# Patient Record
Sex: Male | Born: 1960 | Race: White | Hispanic: No | Marital: Married | Smoking: Never smoker
Health system: Southern US, Community
[De-identification: ages and names within clinical notes are randomized; demographics above are authoritative.]

## PROBLEM LIST (undated history)

## (undated) DIAGNOSIS — J45909 Unspecified asthma, uncomplicated: Secondary | ICD-10-CM

---

## 2014-12-19 ENCOUNTER — Encounter (HOSPITAL_COMMUNITY): Payer: Self-pay | Admitting: Emergency Medicine

## 2014-12-19 ENCOUNTER — Emergency Department (HOSPITAL_COMMUNITY)
Admission: EM | Admit: 2014-12-19 | Discharge: 2014-12-20 | Disposition: A | Discharge status: Home / Self Care | Payer: No Typology Code available for payment source | Attending: Emergency Medicine | Admitting: Emergency Medicine

## 2014-12-19 DIAGNOSIS — S0181XA Laceration without foreign body of other part of head, initial encounter: Secondary | ICD-10-CM | POA: Diagnosis not present

## 2014-12-19 DIAGNOSIS — Y998 Other external cause status: Secondary | ICD-10-CM | POA: Diagnosis not present

## 2014-12-19 DIAGNOSIS — S61411A Laceration without foreign body of right hand, initial encounter: Secondary | ICD-10-CM

## 2014-12-19 DIAGNOSIS — T148XXA Other injury of unspecified body region, initial encounter: Secondary | ICD-10-CM

## 2014-12-19 DIAGNOSIS — Y92410 Unspecified street and highway as the place of occurrence of the external cause: Secondary | ICD-10-CM | POA: Insufficient documentation

## 2014-12-19 DIAGNOSIS — Y9389 Activity, other specified: Secondary | ICD-10-CM | POA: Diagnosis not present

## 2014-12-19 DIAGNOSIS — S6991XA Unspecified injury of right wrist, hand and finger(s), initial encounter: Secondary | ICD-10-CM | POA: Diagnosis present
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
## 2014-12-20 ENCOUNTER — Ambulatory Visit (HOSPITAL_COMMUNITY): Payer: Self-pay
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
## 2014-12-20 NOTE — ED Notes (Signed)
Pt refused CT head and cervical spine

## 2015-08-22 ENCOUNTER — Encounter (HOSPITAL_COMMUNITY): Payer: Self-pay | Admitting: Nurse Practitioner

## 2015-08-22 ENCOUNTER — Emergency Department (HOSPITAL_COMMUNITY): Payer: BLUE CROSS/BLUE SHIELD

## 2015-08-22 ENCOUNTER — Inpatient Hospital Stay (HOSPITAL_COMMUNITY)
Admission: EM | Admit: 2015-08-22 | Discharge: 2015-08-24 | DRG: 964 | Disposition: A | Discharge status: Home Health | Payer: BLUE CROSS/BLUE SHIELD | Attending: General Surgery | Admitting: General Surgery

## 2015-08-22 DIAGNOSIS — W11XXXA Fall on and from ladder, initial encounter: Secondary | ICD-10-CM | POA: Diagnosis present

## 2015-08-22 DIAGNOSIS — S32301A Unspecified fracture of right ilium, initial encounter for closed fracture: Secondary | ICD-10-CM

## 2015-08-22 DIAGNOSIS — S27321A Contusion of lung, unilateral, initial encounter: Secondary | ICD-10-CM | POA: Diagnosis present

## 2015-08-22 DIAGNOSIS — S2231XA Fracture of one rib, right side, initial encounter for closed fracture: Secondary | ICD-10-CM

## 2015-08-22 DIAGNOSIS — S2241XA Multiple fractures of ribs, right side, initial encounter for closed fracture: Secondary | ICD-10-CM | POA: Diagnosis present

## 2015-08-22 DIAGNOSIS — J45909 Unspecified asthma, uncomplicated: Secondary | ICD-10-CM | POA: Diagnosis present

## 2015-08-22 DIAGNOSIS — J939 Pneumothorax, unspecified: Secondary | ICD-10-CM

## 2015-08-22 DIAGNOSIS — R52 Pain, unspecified: Secondary | ICD-10-CM

## 2015-08-22 DIAGNOSIS — S2249XA Multiple fractures of ribs, unspecified side, initial encounter for closed fracture: Secondary | ICD-10-CM | POA: Diagnosis present

## 2015-08-22 DIAGNOSIS — S51811A Laceration without foreign body of right forearm, initial encounter: Secondary | ICD-10-CM

## 2015-08-22 DIAGNOSIS — Z23 Encounter for immunization: Secondary | ICD-10-CM | POA: Diagnosis not present

## 2015-08-22 DIAGNOSIS — S270XXA Traumatic pneumothorax, initial encounter: Secondary | ICD-10-CM | POA: Diagnosis present

## 2015-08-22 DIAGNOSIS — W19XXXA Unspecified fall, initial encounter: Secondary | ICD-10-CM

## 2015-08-22 HISTORY — DX: Unspecified asthma, uncomplicated: J45.909

## 2015-08-22 LAB — COMPREHENSIVE METABOLIC PANEL
ALBUMIN: 3.9 g/dL (ref 3.5–5.0)
ALT: 56 U/L (ref 17–63)
AST: 50 U/L — ABNORMAL HIGH (ref 15–41)
Alkaline Phosphatase: 62 U/L (ref 38–126)
Anion gap: 9 (ref 5–15)
BUN: 12 mg/dL (ref 6–20)
CALCIUM: 9.2 mg/dL (ref 8.9–10.3)
CO2: 26 mmol/L (ref 22–32)
Chloride: 105 mmol/L (ref 101–111)
Creatinine, Ser: 0.93 mg/dL (ref 0.61–1.24)
Glucose, Bld: 162 mg/dL — ABNORMAL HIGH (ref 65–99)
POTASSIUM: 4 mmol/L (ref 3.5–5.1)
Sodium: 140 mmol/L (ref 135–145)
TOTAL PROTEIN: 7.1 g/dL (ref 6.5–8.1)
Total Bilirubin: 0.8 mg/dL (ref 0.3–1.2)

## 2015-08-22 LAB — CBC WITH DIFFERENTIAL/PLATELET
BASOS PCT: 0 %
Basophils Absolute: 0 10*3/uL (ref 0.0–0.1)
Eosinophils Absolute: 0 10*3/uL (ref 0.0–0.7)
Eosinophils Relative: 0 %
HCT: 44.4 % (ref 39.0–52.0)
Hemoglobin: 15.3 g/dL (ref 13.0–17.0)
Lymphocytes Relative: 6 %
Lymphs Abs: 0.8 10*3/uL (ref 0.7–4.0)
MCH: 30.1 pg (ref 26.0–34.0)
MCHC: 34.5 g/dL (ref 30.0–36.0)
MCV: 87.4 fL (ref 78.0–100.0)
MONO ABS: 0.9 10*3/uL (ref 0.1–1.0)
MONOS PCT: 6 %
NEUTROS ABS: 13.1 10*3/uL — AB (ref 1.7–7.7)
Neutrophils Relative %: 88 %
Platelets: 265 10*3/uL (ref 150–400)
RBC: 5.08 MIL/uL (ref 4.22–5.81)
RDW: 11.8 % (ref 11.5–15.5)
WBC: 14.9 10*3/uL — ABNORMAL HIGH (ref 4.0–10.5)

## 2015-08-22 LAB — I-STAT CHEM 8, ED
BUN: 15 mg/dL (ref 6–20)
CALCIUM ION: 1.15 mmol/L (ref 1.12–1.23)
Chloride: 102 mmol/L (ref 101–111)
Creatinine, Ser: 0.8 mg/dL (ref 0.61–1.24)
Glucose, Bld: 161 mg/dL — ABNORMAL HIGH (ref 65–99)
HEMATOCRIT: 50 % (ref 39.0–52.0)
HEMOGLOBIN: 17 g/dL (ref 13.0–17.0)
Potassium: 3.8 mmol/L (ref 3.5–5.1)
Sodium: 140 mmol/L (ref 135–145)
TCO2: 23 mmol/L (ref 0–100)

## 2015-08-22 LAB — PROTIME-INR
INR: 1.15 (ref 0.00–1.49)
Prothrombin Time: 14.9 seconds (ref 11.6–15.2)

## 2015-08-22 LAB — SAMPLE TO BLOOD BANK

## 2015-08-22 LAB — CDS SEROLOGY

## 2015-08-22 MED ORDER — ACETAMINOPHEN 325 MG PO TABS: 650.0000 mg | ORAL_TABLET | ORAL | Status: DC | PRN

## 2015-08-22 MED ORDER — LIDOCAINE HCL (PF) 1 % IJ SOLN
10.0000 mL | Freq: Once | INTRAMUSCULAR | Status: AC
  Administered 2015-08-22: 10 mL via INTRADERMAL
  Filled 2015-08-22: qty 10

## 2015-08-22 MED ORDER — MORPHINE SULFATE (PF) 2 MG/ML IV SOLN
2.0000 mg | INTRAVENOUS | Status: DC | PRN
  Administered 2015-08-23: 2 mg via INTRAVENOUS
  Filled 2015-08-22: qty 1

## 2015-08-22 MED ORDER — ONDANSETRON HCL 4 MG PO TABS: 4.0000 mg | ORAL_TABLET | Freq: Four times a day (QID) | ORAL | Status: DC | PRN

## 2015-08-22 MED ORDER — SODIUM CHLORIDE 0.9 % IJ SOLN: 3.0000 mL | INTRAMUSCULAR | Status: DC | PRN

## 2015-08-22 MED ORDER — MORPHINE SULFATE (PF) 4 MG/ML IV SOLN
4.0000 mg | Freq: Once | INTRAVENOUS | Status: AC
  Administered 2015-08-22: 4 mg via INTRAVENOUS
  Filled 2015-08-22: qty 1

## 2015-08-22 MED ORDER — ENOXAPARIN SODIUM 40 MG/0.4ML ~~LOC~~ SOLN
40.0000 mg | Freq: Every day | SUBCUTANEOUS | Status: DC
  Administered 2015-08-23 – 2015-08-24 (×2): 40 mg via SUBCUTANEOUS
  Filled 2015-08-22 (×2): qty 0.4

## 2015-08-22 MED ORDER — ALBUTEROL SULFATE (2.5 MG/3ML) 0.083% IN NEBU: 2.5000 mg | INHALATION_SOLUTION | RESPIRATORY_TRACT | Status: DC | PRN

## 2015-08-22 MED ORDER — SODIUM CHLORIDE 0.9 % IJ SOLN
3.0000 mL | Freq: Two times a day (BID) | INTRAMUSCULAR | Status: DC
  Administered 2015-08-23 – 2015-08-24 (×3): 3 mL via INTRAVENOUS

## 2015-08-22 MED ORDER — MOMETASONE FUROATE 220 MCG/INH IN AEPB: 1.0000 | INHALATION_SPRAY | Freq: Every evening | RESPIRATORY_TRACT | Status: DC | PRN

## 2015-08-22 MED ORDER — DOCUSATE SODIUM 100 MG PO CAPS
100.0000 mg | ORAL_CAPSULE | Freq: Two times a day (BID) | ORAL | Status: DC
  Administered 2015-08-23 – 2015-08-24 (×2): 100 mg via ORAL
  Filled 2015-08-22 (×3): qty 1

## 2015-08-22 MED ORDER — IOHEXOL 300 MG/ML  SOLN
100.0000 mL | Freq: Once | INTRAMUSCULAR | Status: AC | PRN
  Administered 2015-08-22: 100 mL via INTRAVENOUS

## 2015-08-22 MED ORDER — OXYCODONE HCL 5 MG PO TABS
5.0000 mg | ORAL_TABLET | ORAL | Status: DC | PRN
  Administered 2015-08-23 (×2): 10 mg via ORAL
  Administered 2015-08-23: 5 mg via ORAL
  Administered 2015-08-23 – 2015-08-24 (×3): 10 mg via ORAL
  Filled 2015-08-22 (×3): qty 2
  Filled 2015-08-22: qty 1
  Filled 2015-08-22 (×2): qty 2

## 2015-08-22 MED ORDER — SODIUM CHLORIDE 0.9 % IV SOLN: 250.0000 mL | INTRAVENOUS | Status: DC | PRN

## 2015-08-22 MED ORDER — DIPHENHYDRAMINE HCL 25 MG PO CAPS: 25.0000 mg | ORAL_CAPSULE | Freq: Four times a day (QID) | ORAL | Status: DC | PRN

## 2015-08-22 MED ORDER — ONDANSETRON HCL 4 MG/2ML IJ SOLN: 4.0000 mg | Freq: Four times a day (QID) | INTRAMUSCULAR | Status: DC | PRN

## 2015-08-22 MED ORDER — ALUM & MAG HYDROXIDE-SIMETH 200-200-20 MG/5ML PO SUSP: 30.0000 mL | Freq: Four times a day (QID) | ORAL | Status: DC | PRN

## 2015-08-22 NOTE — ED Provider Notes (Signed)
CSN: [id number]     Arrival date & time 08/22/15  1529 History   First MD Initiated Contact with Patient 08/22/15 1637     Chief Complaint  Patient presents with  . Fall     (Consider location/radiation/quality/duration/timing/severity/associated sxs/prior Treatment) Patient is a 55 y.o. male presenting with fall. The history is provided by the patient and medical records.  Fall Associated symptoms include arthralgias and chest pain.    55 year old male with history of asthma, presenting to the ED after a fall. Patient was approximately 6 feet up on a ladder when he slipped and fell, landing on his chest onto brick surface. He denies any head injury or loss of consciousness.  Patient states he has pain in his right chest wall, worse with lying prone or deep breathing. He states he also has pain in his right hip. He states when he fell he "got the wind knocked out of him".  He has been able to ambulate since accident, actually walked into ED independently.  Denies numbness or weakness of his extremities.  Denies headache, neck pain, dizziness, confusion, visual disturbance, tinnitus, changes in speech.  No low back pain or bowel/bladder incontinence. He did sustain a laceration to his right forearm as well as a long linear abrasion.  Reports tetanus is UTD.  No anti-coagulants.  VSS.  Past Medical History  Diagnosis Date  . Asthma    History reviewed. No pertinent past surgical history. History reviewed. No pertinent family history. Social History  Substance Use Topics  . Smoking status: Never Smoker   . Smokeless tobacco: None  . Alcohol Use: No    Review of Systems  Cardiovascular: Positive for chest pain.  Musculoskeletal: Positive for arthralgias.  All other systems reviewed and are negative.     Allergies  Augmentin  Home Medications   Prior to Admission medications   Not on File   BP 119/83 mmHg  Pulse 89  Temp(Src) 98.7 F (37.1 C) (Oral)  Resp 17  SpO2 99%    Physical Exam  Constitutional: He is oriented to person, place, and time. He appears well-developed and well-nourished. No distress.  HENT:  Head: Normocephalic and atraumatic.  Mouth/Throat: Oropharynx is clear and moist.  No visible signs of head trauma  Eyes: Conjunctivae, EOM and lids are normal. Pupils are equal, round, and reactive to light.  Pupils symmetric and reactive bilaterally  Neck: Normal range of motion and full passive range of motion without pain. Neck supple. No spinous process tenderness and no muscular tenderness present. No rigidity.  No TTP or midline deformities; full ROM maintained  Cardiovascular: Normal rate, regular rhythm and normal heart sounds.   Pulmonary/Chest: Effort normal. No respiratory distress. He has decreased breath sounds. He has no wheezes.  No significant tenderness or deformities of chest wall, reports pain with movement and breathing; decreased breath sounds on right when compared with left  Abdominal: Soft. Bowel sounds are normal.  Musculoskeletal: Normal range of motion.  Abrasion noted to right lateral hip; TTP noted along posterior aspect; no acute deformity noted; no leg shortening or malrotation; legs are NVI bilaterally Vertical 8cm abrasion to right forearm as well as 4cm horizontal laceration; no active bleeding currently; forearm is non-tender; no swelling or deformities noted Extremities are NVI x4, compartments soft  Neurological: He is alert and oriented to person, place, and time.  AAOx3, answering questions appropriately; equal strength UE and LE bilaterally; CN grossly intact; moves all extremities appropriately without ataxia; no focal neuro  deficits or facial asymmetry appreciated  Skin: Skin is warm and dry. He is not diaphoretic.  Psychiatric: He has a normal mood and affect.  Nursing note and vitals reviewed.   ED Course  Procedures (including critical care time)  LACERATION REPAIR Performed by: Garlon Hatchet Authorized by: Garlon Hatchet Consent: Verbal consent obtained. Risks and benefits: risks, benefits and alternatives were discussed Consent given by: patient Patient identity confirmed: provided demographic data Prepped and Draped in normal sterile fashion Wound explored  Laceration Location: right forearm  Laceration Length: 4 cm  No Foreign Bodies seen or palpated  Anesthesia: local infiltration  Local anesthetic: lidocaine 1% without epinephrine  Anesthetic total: 5 ml  Irrigation method: syringe Amount of cleaning: standard  Skin closure: 4-0 prolene  Number of sutures: 5  Technique: simple interrupted  Patient tolerance: Patient tolerated the procedure well with no immediate complications.  Labs Review Labs Reviewed  CBC WITH DIFFERENTIAL/PLATELET - Abnormal; Notable for the following:    WBC 14.9 (*)    Neutro Abs 13.1 (*)    All other components within normal limits  COMPREHENSIVE METABOLIC PANEL - Abnormal; Notable for the following:    Glucose, Bld 162 (*)    AST 50 (*)    All other components within normal limits  I-STAT CHEM 8, ED - Abnormal; Notable for the following:    Glucose, Bld 161 (*)    All other components within normal limits  PROTIME-INR  CDS SEROLOGY  SAMPLE TO BLOOD BANK    Imaging Review Dg Chest 2 View  08/22/2015  CLINICAL DATA:  Patient fell off of a ladder onto chest, painful inspiration currently EXAM: CHEST  2 VIEW COMPARISON:  None. FINDINGS: The heart size and vascular pattern are normal. Minimally displaced fractures of the posterior lateral right sixth seventh and eighth ribs. Possible 1 cm right apical pneumothorax. No pleural effusion. Left lung is clear. IMPRESSION: Right-sided rib fractures with probable small right apical pneumothorax. Confirmatory imaging with either expiratory radiograph or CT thorax suggested. Critical Value/emergent results were called by telephone at the time of interpretation on 08/22/2015 at 5:00 pm  to Dr. Gwyneth Sprout , who verbally acknowledged these results. Electronically Signed   By: Esperanza Heir M.D.   On: 08/22/2015 17:00   Ct Chest W Contrast  08/22/2015  CLINICAL DATA:  Trauma, fell 6 feet from ladder landing on right hip and chest. Right hip pain and difficulty breathing. EXAM: CT CHEST, ABDOMEN, AND PELVIS WITH CONTRAST TECHNIQUE: Multidetector CT imaging of the chest, abdomen and pelvis was performed following the standard protocol during bolus administration of intravenous contrast. CONTRAST:  OMNIPAQUE IOHEXOL 300 MG/ML  SOLN COMPARISON:  Chest and pelvic radiographs earlier this day. FINDINGS: CT CHEST Small right-sided pneumothorax with apical, medial, and anterior inferior components. Minimally displaced fractures of right posterior lateral sixth through eighth ribs with adjacent pleural thickening and minimal adjacent pulmonary contusion. Additional ground-glass opacities in the right lower lobe likely additional sites of contusion. Left lung is clear. No acute traumatic aortic injury. Pleural thickening but no frank pleural effusion. No pericardial effusion. No mediastinal or hilar adenopathy. Sternum is intact. No fracture or subluxation of the thoracic spine. Included shoulder girdles are intact. CT ABDOMEN AND PELVIS No acute traumatic injury to the liver, gallbladder, spleen, adrenal glands, pancreas, or kidneys. Simple cyst in the right kidney measures 12 mm. No free air or free fluid. No mesenteric hematoma. No dilated or thickened bowel loops. Abdominal aorta is  normal in caliber with mild atherosclerosis. No retroperitoneal fluid. Bladder is physiologically distended.  No pelvic free fluid. Comminuted mildly displaced fracture of the right iliac wing. No extension to the sacroiliac joints or acetabulum. Sacroiliac joints and pubic symphysis remain congruent. No additional pelvic fracture. Mild degenerative change in the lumbar spine without acute fracture. Subcutaneous  soft tissue edema and hematoma adjacent to the right iliac fracture, no active extravasation. IMPRESSION: 1. Minimally displaced fractures of right posterior lateral sixth through eighth ribs with small right-sided pneumothorax and minimal pulmonary contusion adjacent to the fracture sites and in the right lower lobe. 2. Comminuted mildly displaced fracture involving the right iliac wing with adjacent soft tissue hematoma. 3. No additional acute traumatic injury in the abdomen or pelvis. Intra-abdominal and pelvic structures appear intact. These results were called by telephone at the time of interpretation on 08/22/2015 at 6:10 pm to [facility] , who verbally acknowledged these results. Electronically Signed   By: Rubye OaksMelanie  Ehinger M.D.   On: 08/22/2015 18:11   Ct Abdomen Pelvis W Contrast  08/22/2015  CLINICAL DATA:  Trauma, fell 6 feet from ladder landing on right hip and chest. Right hip pain and difficulty breathing. EXAM: CT CHEST, ABDOMEN, AND PELVIS WITH CONTRAST TECHNIQUE: Multidetector CT imaging of the chest, abdomen and pelvis was performed following the standard protocol during bolus administration of intravenous contrast. CONTRAST:  100mL OMNIPAQUE IOHEXOL 300 MG/ML  SOLN COMPARISON:  Chest and pelvic radiographs earlier this day. FINDINGS: CT CHEST Small right-sided pneumothorax with apical, medial, and anterior inferior components. Minimally displaced fractures of right posterior lateral sixth through eighth ribs with adjacent pleural thickening and minimal adjacent pulmonary contusion. Additional ground-glass opacities in the right lower lobe likely additional sites of contusion. Left lung is clear. No acute traumatic aortic injury. Pleural thickening but no frank pleural effusion. No pericardial effusion. No mediastinal or hilar adenopathy. Sternum is intact. No fracture or subluxation of the thoracic spine. Included shoulder girdles are intact. CT ABDOMEN AND PELVIS No acute traumatic injury  to the liver, gallbladder, spleen, adrenal glands, pancreas, or kidneys. Simple cyst in the right kidney measures 12 mm. No free air or free fluid. No mesenteric hematoma. No dilated or thickened bowel loops. Abdominal aorta is normal in caliber with mild atherosclerosis. No retroperitoneal fluid. Bladder is physiologically distended.  No pelvic free fluid. Comminuted mildly displaced fracture of the right iliac wing. No extension to the sacroiliac joints or acetabulum. Sacroiliac joints and pubic symphysis remain congruent. No additional pelvic fracture. Mild degenerative change in the lumbar spine without acute fracture. Subcutaneous soft tissue edema and hematoma adjacent to the right iliac fracture, no active extravasation. IMPRESSION: 1. Minimally displaced fractures of right posterior lateral sixth through eighth ribs with small right-sided pneumothorax and minimal pulmonary contusion adjacent to the fracture sites and in the right lower lobe. 2. Comminuted mildly displaced fracture involving the right iliac wing with adjacent soft tissue hematoma. 3. No additional acute traumatic injury in the abdomen or pelvis. Intra-abdominal and pelvic structures appear intact. These results were called by telephone at the time of interpretation on 08/22/2015 at 6:10 pm to [facility] , who verbally acknowledged these results. Electronically Signed   By: Rubye OaksMelanie  Ehinger M.D.   On: 08/22/2015 18:11   Dg Hip Unilat With Pelvis 2-3 Views Right  08/22/2015  CLINICAL DATA:  Fall. EXAM: DG HIP (WITH OR WITHOUT PELVIS) 2-3V RIGHT COMPARISON:  None. FINDINGS: There is a comminuted fracture involving the right  iliac wing. The right hip appears located. No dislocations. Mild bilateral hip osteoarthritis. IMPRESSION: 1. Comminuted fracture involves the right iliac wing. Electronically Signed   By: Signa Kell M.D.   On: 08/22/2015 16:57   I have personally reviewed and evaluated these images and lab results as part of my  medical decision-making.   EKG Interpretation None      MDM   Final diagnoses:  Rib fractures, right, closed, initial encounter  Pneumothorax on right  Fracture of iliac crest, right, closed, initial encounter [facility])   55 year old male here after fall from 6 foot ladder. He landed onto anterior chest on brick surface. No head injury or loss of consciousness. He denies any current headache, dizziness, or neck pain. Patient has no CS tenderness on exam. He has full range of motion. He is neurologically intact. He has normal strength and sensation of all 4 extremities. He reports pain on the right side of his chest with breathing and movement, no significant tenderness with palpation.  Also has some abrasions and tenderness of right hip.  His vital signs are stable on room air.  Plain films with displaced rib fractures and likely PTX as well as right pelvic wing fracture.  Given mechanism, will obtain chest and abdominal scans.  No neurologic symptoms currently, no neck pain, and no anti-coagulants so will hold off on CT head/neck at this time.    CT chest with confirmation of displaced right 6th- 8th ribs and small right sided PTX with pulmonary contusion.  Communited and mildly displaced fx of right iliac wing with soft tissue hematoma.  Patient VS remain stable.  He is on 2L supplemental O2 for comfort.  Case discussed with trauma service, Dr. Donell Beers who will evaluate in the ED for admission.  His right forearm laceration was repaired as above, he tolerated procedure well without complications.  His tetanus is UTD.  Case discussed with attending physician, Dr. Anitra Lauth, who evaluated patient and agrees with assessment and plan of care.  Garlon Hatchet, PA-C 08/22/15 1956  Gwyneth Sprout, MD 08/22/15 2325

## 2015-08-22 NOTE — ED Notes (Addendum)
Pt fell about 6 feet off a ladder landing on anterior body onto bricks. He denies head injury, loc. He has laceration to R elbow dressed by ems, abrasion to RFA, R hip pain, and "felt like i got the wind knocked out of me." c/o painful inspiration. He is A&Ox4, mae, breathing easily.

## 2015-08-22 NOTE — H&P (Signed)
History   Jacob Edwards is an 55 y.o. male.   Chief Complaint:  Chief Complaint  Patient presents with  . Fall    Fall This is a new problem. The current episode started today. The problem has been unchanged. Associated symptoms include chest pain. Pertinent negatives include no abdominal pain, anorexia, arthralgias, change in bowel habit, chills, congestion, coughing, diaphoresis, fatigue, fever, headaches, joint swelling, myalgias, nausea, neck pain, numbness, rash, sore throat, swollen glands, urinary symptoms, vertigo, visual change, vomiting or weakness. The symptoms are aggravated by exertion.   Patient was on a ladder today and fell 6 feet.  His chest and side struck the brick.  He did not strike his head or neck.  He had not been drinking or using illicit substances.  He had no LOC.  He was able to ambulate.  He felt like the wind got knocked out of him.    Past Medical History  Diagnosis Date  . Asthma     History reviewed. No pertinent past surgical history.  History reviewed. No pertinent family history. Social History:  reports that he has never smoked. He does not have any smokeless tobacco history on file. He reports that he does not drink alcohol or use illicit drugs.  Allergies   Allergies  Allergen Reactions  . Augmentin [Amoxicillin-Pot Clavulanate] Hives    Home Medications   (Not in a hospital admission)  Trauma Course   Results for orders placed or performed during the hospital encounter of 08/22/15 (from the past 48 hour(s))  CBC with Differential     Status: Abnormal   Collection Time: 08/22/15  5:30 PM  Result Value Ref Range   WBC 14.9 (H) 4.0 - 10.5 K/uL   RBC 5.08 4.22 - 5.81 MIL/uL   Hemoglobin 15.3 13.0 - 17.0 g/dL   HCT 44.4 39.0 - 52.0 %   MCV 87.4 78.0 - 100.0 fL   MCH 30.1 26.0 - 34.0 pg   MCHC 34.5 30.0 - 36.0 g/dL   RDW 11.8 11.5 - 15.5 %   Platelets 265 150 - 400 K/uL   Neutrophils Relative % 88 %   Neutro Abs 13.1 (H) 1.7 - 7.7  K/uL   Lymphocytes Relative 6 %   Lymphs Abs 0.8 0.7 - 4.0 K/uL   Monocytes Relative 6 %   Monocytes Absolute 0.9 0.1 - 1.0 K/uL   Eosinophils Relative 0 %   Eosinophils Absolute 0.0 0.0 - 0.7 K/uL   Basophils Relative 0 %   Basophils Absolute 0.0 0.0 - 0.1 K/uL  Comprehensive metabolic panel     Status: Abnormal   Collection Time: 08/22/15  5:30 PM  Result Value Ref Range   Sodium 140 135 - 145 mmol/L   Potassium 4.0 3.5 - 5.1 mmol/L   Chloride 105 101 - 111 mmol/L   CO2 26 22 - 32 mmol/L   Glucose, Bld 162 (H) 65 - 99 mg/dL   BUN 12 6 - 20 mg/dL   Creatinine, Ser 0.93 0.61 - 1.24 mg/dL   Calcium 9.2 8.9 - 10.3 mg/dL   Total Protein 7.1 6.5 - 8.1 g/dL   Albumin 3.9 3.5 - 5.0 g/dL   AST 50 (H) 15 - 41 U/L   ALT 56 17 - 63 U/L   Alkaline Phosphatase 62 38 - 126 U/L   Total Bilirubin 0.8 0.3 - 1.2 mg/dL   GFR calc non Af Amer >60 >60 mL/min   GFR calc Af Amer >60 >60 mL/min  Comment: (NOTE) The eGFR has been calculated using the CKD EPI equation. This calculation has not been validated in all clinical situations. eGFR's persistently <60 mL/min signify possible Chronic Kidney Disease.    Anion gap 9 5 - 15  Sample to Blood Bank     Status: None   Collection Time: 08/22/15  5:30 PM  Result Value Ref Range   Blood Bank Specimen SAMPLE AVAILABLE FOR TESTING    Sample Expiration 08/23/2015   Protime-INR     Status: None   Collection Time: 08/22/15  5:30 PM  Result Value Ref Range   Prothrombin Time 14.9 11.6 - 15.2 seconds   INR 1.15 0.00 - 1.49  CDS serology     Status: None   Collection Time: 08/22/15  5:30 PM  Result Value Ref Range   CDS serology specimen      SPECIMEN WILL BE HELD FOR 14 DAYS IF TESTING IS REQUIRED  I-stat chem 8, ed     Status: Abnormal   Collection Time: 08/22/15  6:08 PM  Result Value Ref Range   Sodium 140 135 - 145 mmol/L   Potassium 3.8 3.5 - 5.1 mmol/L   Chloride 102 101 - 111 mmol/L   BUN 15 6 - 20 mg/dL   Creatinine, Ser 0.80 0.61 -  1.24 mg/dL   Glucose, Bld 161 (H) 65 - 99 mg/dL   Calcium, Ion 1.15 1.12 - 1.23 mmol/L   TCO2 23 0 - 100 mmol/L   Hemoglobin 17.0 13.0 - 17.0 g/dL   HCT 50.0 39.0 - 52.0 %   Dg Chest 2 View  08/22/2015  CLINICAL DATA:  Patient fell off of a ladder onto chest, painful inspiration currently EXAM: CHEST  2 VIEW COMPARISON:  None. FINDINGS: The heart size and vascular pattern are normal. Minimally displaced fractures of the posterior lateral right sixth seventh and eighth ribs. Possible 1 cm right apical pneumothorax. No pleural effusion. Left lung is clear. IMPRESSION: Right-sided rib fractures with probable small right apical pneumothorax. Confirmatory imaging with either expiratory radiograph or CT thorax suggested. Critical Value/emergent results were called by telephone at the time of interpretation on 08/22/2015 at 5:00 pm to Dr. Blanchie Dessert , who verbally acknowledged these results. Electronically Signed   By: Skipper Cliche M.D.   On: 08/22/2015 17:00   Ct Chest W Contrast  08/22/2015  CLINICAL DATA:  Trauma, fell 6 feet from ladder landing on right hip and chest. Right hip pain and difficulty breathing. EXAM: CT CHEST, ABDOMEN, AND PELVIS WITH CONTRAST TECHNIQUE: Multidetector CT imaging of the chest, abdomen and pelvis was performed following the standard protocol during bolus administration of intravenous contrast. CONTRAST:  166m OMNIPAQUE IOHEXOL 300 MG/ML  SOLN COMPARISON:  Chest and pelvic radiographs earlier this day. FINDINGS: CT CHEST Small right-sided pneumothorax with apical, medial, and anterior inferior components. Minimally displaced fractures of right posterior lateral sixth through eighth ribs with adjacent pleural thickening and minimal adjacent pulmonary contusion. Additional ground-glass opacities in the right lower lobe likely additional sites of contusion. Left lung is clear. No acute traumatic aortic injury. Pleural thickening but no frank pleural effusion. No pericardial  effusion. No mediastinal or hilar adenopathy. Sternum is intact. No fracture or subluxation of the thoracic spine. Included shoulder girdles are intact. CT ABDOMEN AND PELVIS No acute traumatic injury to the liver, gallbladder, spleen, adrenal glands, pancreas, or kidneys. Simple cyst in the right kidney measures 12 mm. No free air or free fluid. No mesenteric hematoma. No dilated or  thickened bowel loops. Abdominal aorta is normal in caliber with mild atherosclerosis. No retroperitoneal fluid. Bladder is physiologically distended.  No pelvic free fluid. Comminuted mildly displaced fracture of the right iliac wing. No extension to the sacroiliac joints or acetabulum. Sacroiliac joints and pubic symphysis remain congruent. No additional pelvic fracture. Mild degenerative change in the lumbar spine without acute fracture. Subcutaneous soft tissue edema and hematoma adjacent to the right iliac fracture, no active extravasation. IMPRESSION: 1. Minimally displaced fractures of right posterior lateral sixth through eighth ribs with small right-sided pneumothorax and minimal pulmonary contusion adjacent to the fracture sites and in the right lower lobe. 2. Comminuted mildly displaced fracture involving the right iliac wing with adjacent soft tissue hematoma. 3. No additional acute traumatic injury in the abdomen or pelvis. Intra-abdominal and pelvic structures appear intact. These results were called by telephone at the time of interpretation on 08/22/2015 at 6:10 pm to [place] , who verbally acknowledged these results. Electronically Signed   By: Jeb Levering M.D.   On: 08/22/2015 18:11   Ct Abdomen Pelvis W Contrast  08/22/2015  CLINICAL DATA:  Trauma, fell 6 feet from ladder landing on right hip and chest. Right hip pain and difficulty breathing. EXAM: CT CHEST, ABDOMEN, AND PELVIS WITH CONTRAST TECHNIQUE: Multidetector CT imaging of the chest, abdomen and pelvis was performed following the standard protocol  during bolus administration of intravenous contrast. CONTRAST:  119m OMNIPAQUE IOHEXOL 300 MG/ML  SOLN COMPARISON:  Chest and pelvic radiographs earlier this day. FINDINGS: CT CHEST Small right-sided pneumothorax with apical, medial, and anterior inferior components. Minimally displaced fractures of right posterior lateral sixth through eighth ribs with adjacent pleural thickening and minimal adjacent pulmonary contusion. Additional ground-glass opacities in the right lower lobe likely additional sites of contusion. Left lung is clear. No acute traumatic aortic injury. Pleural thickening but no frank pleural effusion. No pericardial effusion. No mediastinal or hilar adenopathy. Sternum is intact. No fracture or subluxation of the thoracic spine. Included shoulder girdles are intact. CT ABDOMEN AND PELVIS No acute traumatic injury to the liver, gallbladder, spleen, adrenal glands, pancreas, or kidneys. Simple cyst in the right kidney measures 12 mm. No free air or free fluid. No mesenteric hematoma. No dilated or thickened bowel loops. Abdominal aorta is normal in caliber with mild atherosclerosis. No retroperitoneal fluid. Bladder is physiologically distended.  No pelvic free fluid. Comminuted mildly displaced fracture of the right iliac wing. No extension to the sacroiliac joints or acetabulum. Sacroiliac joints and pubic symphysis remain congruent. No additional pelvic fracture. Mild degenerative change in the lumbar spine without acute fracture. Subcutaneous soft tissue edema and hematoma adjacent to the right iliac fracture, no active extravasation. IMPRESSION: 1. Minimally displaced fractures of right posterior lateral sixth through eighth ribs with small right-sided pneumothorax and minimal pulmonary contusion adjacent to the fracture sites and in the right lower lobe. 2. Comminuted mildly displaced fracture involving the right iliac wing with adjacent soft tissue hematoma. 3. No additional acute traumatic  injury in the abdomen or pelvis. Intra-abdominal and pelvic structures appear intact. These results were called by telephone at the time of interpretation on 08/22/2015 at 6:10 pm to [place], who verbally acknowledged these results. Electronically Signed   By: MJeb LeveringM.D.   On: 08/22/2015 18:11   Dg Hip Unilat With Pelvis 2-3 Views Right  08/22/2015  CLINICAL DATA:  Fall. EXAM: DG HIP (WITH OR WITHOUT PELVIS) 2-3V RIGHT COMPARISON:  None. FINDINGS: There is  a comminuted fracture involving the right iliac wing. The right hip appears located. No dislocations. Mild bilateral hip osteoarthritis. IMPRESSION: 1. Comminuted fracture involves the right iliac wing. Electronically Signed   By: Kerby Moors M.D.   On: 08/22/2015 16:57    Review of Systems  Constitutional: Negative for fever, chills, diaphoresis and fatigue.  HENT: Negative for congestion and sore throat.   Eyes: Negative.   Respiratory: Positive for shortness of breath (mild). Negative for cough.   Cardiovascular: Positive for chest pain.  Gastrointestinal: Negative for nausea, vomiting, abdominal pain, anorexia and change in bowel habit.  Musculoskeletal: Positive for joint pain (right hip/back). Negative for myalgias, joint swelling, arthralgias and neck pain.  Skin: Negative for rash.  Neurological: Negative.  Negative for vertigo, weakness, numbness and headaches.  Endo/Heme/Allergies: Negative.   Psychiatric/Behavioral: Negative.     Blood pressure 118/72, pulse 102, temperature 98.7 F (37.1 C), temperature source Oral, resp. rate 23, SpO2 100 %. Physical Exam  Constitutional: He is oriented to person, place, and time. He appears well-developed and well-nourished. He appears distressed (mild).  HENT:  Head: Normocephalic and atraumatic.  Right Ear: External ear normal.  Left Ear: External ear normal.  Mouth/Throat: Oropharynx is clear and moist.  Eyes: Conjunctivae are normal. Pupils are equal, round, and  reactive to light. No scleral icterus.  Neck: Normal range of motion. Neck supple. No tracheal deviation present. No thyromegaly present.  Cardiovascular: Normal rate, regular rhythm, normal heart sounds and intact distal pulses.   Respiratory: Effort normal and breath sounds normal. No respiratory distress. He has no wheezes. He has no rales. He exhibits no tenderness.  GI: Soft. He exhibits no distension. There is no tenderness. There is no rebound.  Musculoskeletal: Normal range of motion. He exhibits tenderness. He exhibits no edema.  Lymphadenopathy:    He has no cervical adenopathy.  Neurological: He is alert and oriented to person, place, and time. Coordination normal.  Skin: Skin is warm and dry. No rash noted. He is not diaphoretic. No erythema. No pallor.  Psychiatric: He has a normal mood and affect. His behavior is normal. Judgment and thought content normal.     Assessment/Plan Right pneumothorax Right rib fractures 6-8 Right pulmonary contusion Right forearm laceration Right iliac wing fracture.   Admit for pain control Pulmonary toilet Continuous pulse ox monitoring.   ED to repair forearm laceration Repeat CXR in AM Ortho consult   Job Holtsclaw 08/22/2015, 7:12 PM   Procedures

## 2015-08-23 ENCOUNTER — Inpatient Hospital Stay (HOSPITAL_COMMUNITY): Payer: BLUE CROSS/BLUE SHIELD

## 2015-08-23 LAB — MRSA PCR SCREENING: MRSA BY PCR: NEGATIVE

## 2015-08-23 MED ORDER — ENSURE ENLIVE PO LIQD
237.0000 mL | Freq: Three times a day (TID) | ORAL | Status: DC
  Administered 2015-08-23 – 2015-08-24 (×4): 237 mL via ORAL

## 2015-08-23 MED ORDER — PNEUMOCOCCAL VAC POLYVALENT 25 MCG/0.5ML IJ INJ
0.5000 mL | INJECTION | INTRAMUSCULAR | Status: AC
  Administered 2015-08-24: 0.5 mL via INTRAMUSCULAR
  Filled 2015-08-23: qty 0.5

## 2015-08-23 MED ORDER — POLYETHYLENE GLYCOL 3350 17 G PO PACK
17.0000 g | PACK | Freq: Every day | ORAL | Status: DC
  Administered 2015-08-24: 17 g via ORAL
  Filled 2015-08-23: qty 1

## 2015-08-23 MED ORDER — METHOCARBAMOL 500 MG PO TABS
1000.0000 mg | ORAL_TABLET | Freq: Three times a day (TID) | ORAL | Status: DC | PRN
  Administered 2015-08-23 – 2015-08-24 (×3): 1000 mg via ORAL
  Filled 2015-08-23 (×3): qty 2

## 2015-08-23 NOTE — Progress Notes (Deleted)
Report given 5N.  .BP 130/79 mmHg  Pulse 92  Temp(Src) 98.1 F (36.7 C) (Oral)  Resp 26  Ht 5\' 4"  (1.626 m)  Wt 49.9 kg (110 lb 0.2 oz)  BMI 18.87 kg/m2  SpO2 100%

## 2015-08-23 NOTE — Progress Notes (Signed)
[facility]  Progress Note   LOS: 1 day   Subjective: Pt doing okay, pain fairly well controlled.  No N/V, tolerating diet, but a bit anorexic.  Worried about having a BM.  Pain in right hip and chest.  No SOB, no headache.  No pain in extremities, neck, or back.  Objective: Vital signs in last 24 hours: Temp:  [98.1 F (36.7 C)-99 F (37.2 C)] 98.2 F (36.8 C) (01/09 0827) Pulse Rate:  [67-102] 69 (01/09 0600) Resp:  [17-23] 19 (01/09 0600) BP: (112-126)/(71-84) 113/73 mmHg (01/09 0600) SpO2:  [97 %-100 %] 99 % (01/09 0600) Weight:  [49.9 kg (110 lb 0.2 oz)] 49.9 kg (110 lb 0.2 oz) (01/08 2330)    Lab Results:  CBC  Recent Labs  08/22/15 1730 08/22/15 1808  WBC 14.9*  --   HGB 15.3 17.0  HCT 44.4 50.0  PLT 265  --    BMET  Recent Labs  08/22/15 1730 08/22/15 1808  NA 140 140  K 4.0 3.8  CL 105 102  CO2 26  --   GLUCOSE 162* 161*  BUN 12 15  CREATININE 0.93 0.80  CALCIUM 9.2  --     Imaging: Dg Chest 2 View  08/23/2015  CLINICAL DATA:  Bilateral rib pain.  Fall. EXAM: CHEST  2 VIEW COMPARISON:  CT 08/22/2015. FINDINGS: Mediastinum hilar structures stable. Heart size stable. Small right apical pneumothorax again noted. Right rib fractures best demonstrated by CT. IMPRESSION: Small right apical pneumothorax again noted. No interim change. Right rib fractures best demonstrated by CT. Electronically Signed   By: Maisie Fushomas  Register   On: 08/23/2015 08:11   Dg Chest 2 View  08/22/2015  CLINICAL DATA:  Patient fell off of a ladder onto chest, painful inspiration currently EXAM: CHEST  2 VIEW COMPARISON:  None. FINDINGS: The heart size and vascular pattern are normal. Minimally displaced fractures of the posterior lateral right sixth seventh and eighth ribs. Possible 1 cm right apical pneumothorax. No pleural effusion. Left lung is clear. IMPRESSION: Right-sided rib fractures with probable small right apical pneumothorax. Confirmatory imaging  with either expiratory radiograph or CT thorax suggested. Critical Value/emergent results were called by telephone at the time of interpretation on 08/22/2015 at 5:00 pm to Dr. Gwyneth SproutWHITNEY PLUNKETT , who verbally acknowledged these results. Electronically Signed   By: Esperanza Heiraymond  Rubner M.D.   On: 08/22/2015 17:00   Ct Chest W Contrast  08/22/2015  CLINICAL DATA:  Trauma, fell 6 feet from ladder landing on right hip and chest. Right hip pain and difficulty breathing. EXAM: CT CHEST, ABDOMEN, AND PELVIS WITH CONTRAST TECHNIQUE: Multidetector CT imaging of the chest, abdomen and pelvis was performed following the standard protocol during bolus administration of intravenous contrast. CONTRAST:  100mL OMNIPAQUE IOHEXOL 300 MG/ML  SOLN COMPARISON:  Chest and pelvic radiographs earlier this day. FINDINGS: CT CHEST Small right-sided pneumothorax with apical, medial, and anterior inferior components. Minimally displaced fractures of right posterior lateral sixth through eighth ribs with adjacent pleural thickening and minimal adjacent pulmonary contusion. Additional ground-glass opacities in the right lower lobe likely additional sites of contusion. Left lung is clear. No acute traumatic aortic injury. Pleural thickening but no frank pleural effusion. No pericardial effusion. No mediastinal or hilar adenopathy. Sternum is intact. No fracture or subluxation of the thoracic spine. Included shoulder girdles are intact. CT ABDOMEN AND PELVIS No acute traumatic injury to the liver, gallbladder, spleen, adrenal glands, pancreas, or kidneys. Simple cyst in the  right kidney measures 12 mm. No free air or free fluid. No mesenteric hematoma. No dilated or thickened bowel loops. Abdominal aorta is normal in caliber with mild atherosclerosis. No retroperitoneal fluid. Bladder is physiologically distended.  No pelvic free fluid. Comminuted mildly displaced fracture of the right iliac wing. No extension to the sacroiliac joints or acetabulum.  Sacroiliac joints and pubic symphysis remain congruent. No additional pelvic fracture. Mild degenerative change in the lumbar spine without acute fracture. Subcutaneous soft tissue edema and hematoma adjacent to the right iliac fracture, no active extravasation. IMPRESSION: 1. Minimally displaced fractures of right posterior lateral sixth through eighth ribs with small right-sided pneumothorax and minimal pulmonary contusion adjacent to the fracture sites and in the right lower lobe. 2. Comminuted mildly displaced fracture involving the right iliac wing with adjacent soft tissue hematoma. 3. No additional acute traumatic injury in the abdomen or pelvis. Intra-abdominal and pelvic structures appear intact. These results were called by telephone at the time of interpretation on 08/22/2015 at 6:10 pm to PA [facility] , who verbally acknowledged these results. Electronically Signed   By: Rubye Oaks M.D.   On: 08/22/2015 18:11   Ct Abdomen Pelvis W Contrast  08/22/2015  CLINICAL DATA:  Trauma, fell 6 feet from ladder landing on right hip and chest. Right hip pain and difficulty breathing. EXAM: CT CHEST, ABDOMEN, AND PELVIS WITH CONTRAST TECHNIQUE: Multidetector CT imaging of the chest, abdomen and pelvis was performed following the standard protocol during bolus administration of intravenous contrast. CONTRAST:  OMNIPAQUE IOHEXOL 300 MG/ML  SOLN COMPARISON:  Chest and pelvic radiographs earlier this day. FINDINGS: CT CHEST Small right-sided pneumothorax with apical, medial, and anterior inferior components. Minimally displaced fractures of right posterior lateral sixth through eighth ribs with adjacent pleural thickening and minimal adjacent pulmonary contusion. Additional ground-glass opacities in the right lower lobe likely additional sites of contusion. Left lung is clear. No acute traumatic aortic injury. Pleural thickening but no frank pleural effusion. No pericardial effusion. No mediastinal or hilar  adenopathy. Sternum is intact. No fracture or subluxation of the thoracic spine. Included shoulder girdles are intact. CT ABDOMEN AND PELVIS No acute traumatic injury to the liver, gallbladder, spleen, adrenal glands, pancreas, or kidneys. Simple cyst in the right kidney measures 12 mm. No free air or free fluid. No mesenteric hematoma. No dilated or thickened bowel loops. Abdominal aorta is normal in caliber with mild atherosclerosis. No retroperitoneal fluid. Bladder is physiologically distended.  No pelvic free fluid. Comminuted mildly displaced fracture of the right iliac wing. No extension to the sacroiliac joints or acetabulum. Sacroiliac joints and pubic symphysis remain congruent. No additional pelvic fracture. Mild degenerative change in the lumbar spine without acute fracture. Subcutaneous soft tissue edema and hematoma adjacent to the right iliac fracture, no active extravasation. IMPRESSION: 1. Minimally displaced fractures of right posterior lateral sixth through eighth ribs with small right-sided pneumothorax and minimal pulmonary contusion adjacent to the fracture sites and in the right lower lobe. 2. Comminuted mildly displaced fracture involving the right iliac wing with adjacent soft tissue hematoma. 3. No additional acute traumatic injury in the abdomen or pelvis. Intra-abdominal and pelvic structures appear intact. These results were called by telephone at the time of interpretation on 08/22/2015 at 6:10 pm to PA [facility] , who verbally acknowledged these results. Electronically Signed   By: Rubye Oaks M.D.   On: 08/22/2015 18:11   Dg Hip Unilat With Pelvis 2-3 Views Right  08/22/2015  CLINICAL DATA:  Fall. EXAM: DG HIP (WITH OR WITHOUT PELVIS) 2-3V RIGHT COMPARISON:  None. FINDINGS: There is a comminuted fracture involving the right iliac wing. The right hip appears located. No dislocations. Mild bilateral hip osteoarthritis. IMPRESSION: 1. Comminuted fracture involves the right iliac  wing. Electronically Signed   By: Signa Kell M.D.   On: 08/22/2015 16:57     PE: General: pleasant, WD/WN white male who is laying in bed in NAD HEENT: head is normocephalic, atraumatic.  Sclera are noninjected.  PERRL.  Ears and nose without any masses or lesions.  Mouth is pink and moist Heart: regular, rate, and rhythm.  Normal s1,s2. No obvious murmurs, gallops, or rubs noted.  Palpable radial and pedal pulses bilaterally Lungs: pain in right/left chest.  CTAB, no wheezes, rhonchi, or rales noted.  Respiratory effort nonlabored, IS to 2200. Abd: soft, NT/ND, +BS, no masses, hernias, or organomegaly MS: dry dressing on right forearm.  all 4 extremities are symmetrical with no cyanosis, clubbing, or edema.  Gross CSM intact to all 4 extremities.  Pain in right hip. Psych: A&Ox3 with an appropriate affect.   Assessment/Plan: Fall off ladder Right pneumothorax - f/u CXR stable, Pulm toilet, hopefully won't require CT, continuous pulse ox Right rib fractures 6-8 - IS up to 2200 Right pulmonary contusion  Right forearm laceration - ED repaired, local care Right iliac wing fracture - per Dr. Eulah Pont, WBAT right leg, no surgery planned VTE - SCD's, Lovenox  FEN - reg diet, add ensure, labs in am, add robaxin Dispo -- Therapy, pain control, rehab vs home, transfer to floor   Jorje Guild, [place] Pager: [phone] General Trauma [place] Pager: [phone]   08/23/2015

## 2015-08-23 NOTE — Clinical Social Work Note (Signed)
Clinical Social Work Assessment  Patient Details  Name: Jacob Edwards MRN: [id number] Date of Birth: 09/12/1960  Date of referral:  08/23/15               Reason for consult:  Trauma                Permission sought to share information with:  Family Supports Permission granted to share information::  Yes, Verbal Permission Granted  Name::     Hannah Raya  Relationship::  Spouse  Contact Information:  [phone]  Housing/Transportation Living arrangements for the past 2 months:  Single Family Home Source of Information:  Patient, Spouse Patient Interpreter Needed:  None Criminal Activity/Legal Involvement Pertinent to Current Situation/Hospitalization:  No - Comment as needed Significant Relationships:  Spouse, Dependent Children Lives with:  Minor Children, Spouse Do you feel safe going back to the place where you live?  Yes Need for family participation in patient care:  Yes (Comment)  Care giving concerns:  Patient spouse states that patient has several relatives to provide support and his employer plans to allow him to work from home.  Patient spouse did inquire about home health support and equipment - CSW explained CM role and follow up once evaluated by therapies.   Social Worker assessment / plan:  Clinical Social Worker met with patient and spouse at bedside to offer support and discuss patient needs at discharge.  Patient states that he was up on a ladder attempting to clean out the gutter when the ladder slipped and he fell.  Patient and wife both state that patient will be able to remain all on one level once discharged home.  Patient with good family support and hopeful that he will work well with therapies and return home.  Patient spouse prepared to take time off from work and additional family members have agreed to provide support.  Patient agreeable with plans to return home at discharge.  Currently there are no concerns regarding substance use.  SBIRT completed.  No  resources needed at this time.  CSW signing off.  Please reconsult if further social work needs arise.  Employment status:  Full-Time Insurance information:  Managed Care PT Recommendations:  Not assessed at this time Information / Referral to community resources:  SBIRT  Patient/Family's Response to care:  Patient and patient wife agreeable with return home at discharge and understanding for the potential need for additional rehab.  Patient and family verbalize understanding of CSW role and appreciation for support.  Patient/Family's Understanding of and Emotional Response to Diagnosis, Current Treatment, and Prognosis:  Patient and patient family realistic regarding the potential for patient needs at discharge.  Patient coping well with his injuries and family appropriately planning for patient needs at home.  Emotional Assessment Appearance:  Appears stated age Attitude/Demeanor/Rapport:   (Cooperative and Appropriate) Affect (typically observed):  Appropriate, Calm, Flat, Pleasant Orientation:  Oriented to Self, Oriented to Place, Oriented to  Time, Oriented to Situation Alcohol / Substance use:  Never Used Psych involvement (Current and /or in the community):  No (Comment)  Discharge Needs  Concerns to be addressed:  Care Coordination Readmission within the last 30 days:  No Current discharge risk:  None Barriers to Discharge:  Continued Medical Work up   Jesse Scinto, LCSW [phone]  

## 2015-08-23 NOTE — Progress Notes (Signed)
Occupational Therapy Evaluation Patient Details Name: Jacob AhmadiCarl Kitko MRN: [id number] DOB: 05/27/1961 Today's Date: 08/23/2015    History of Present Illness s/p fall off ladder with resulting R pneumothorax, R rib fxs 6-8; R pulmonary contusion; R forearm laceration - repaired in ED; R iliac wing fx (WBAT)   Clinical Impression   PTA, pt independent with ADL and mobility. Pt presents with functional decline with ADL and mobility due to deficits below. Pt able to take a few steps to the recliner with RW. O2 Sats 98-100 RA. Limited primarily by pain. Pt has w/c and RW at home and will most likely be able to D/C home with initial 24/7 S. Will follow acutely to maximize independence with ADL and facilitate safe D/C home. Clarified WBS as RLE WBAT over phone with MD. Nsg notified.    Follow Up Recommendations  Home health OT;Supervision/Assistance - 24 hour (initially)    Equipment Recommendations  3 in 1 bedside comode (if pt does not have one)    Recommendations for Other Services       Precautions / Restrictions Precautions Precautions: Fall Restrictions Weight Bearing Restrictions: Yes RLE Weight Bearing: Weight bearing as tolerated      Mobility Bed Mobility Overal bed mobility: Needs Assistance Bed Mobility: Supine to Sit (from elevated position)     Supine to sit: Min assist        Transfers Overall transfer level: Needs assistance Equipment used: Rolling walker (2 wheeled) Transfers: Sit to/from UGI CorporationStand;Stand Pivot Transfers Sit to Stand: Min assist Stand pivot transfers: Min guard            Balance Overall balance assessment: Needs assistance   Sitting balance-Leahy Scale: Good       Standing balance-Leahy Scale: Fair                              ADL Overall ADL's : Needs assistance/impaired     Grooming: Set up   Upper Body Bathing: Set up;Sitting   Lower Body Bathing: Moderate assistance;Sit to/from stand   Upper Body Dressing :  Minimal assistance   Lower Body Dressing: Moderate assistance;Sit to/from stand   Toilet Transfer: Minimal assistance   Toileting- ArchitectClothing Manipulation and Hygiene: Min guard       Functional mobility during ADLs: Minimal assistance;Rolling walker;Cueing for sequencing General ADL Comments: limited by pain     Vision     Perception     Praxis      Pertinent Vitals/Pain Pain Assessment: 0-10 Pain Score: 7  Pain Location: R hip with movement Pain Descriptors / Indicators: Aching;Shooting Pain Intervention(s): Limited activity within patient's tolerance;Monitored during session;Repositioned;Patient requesting pain meds-RN notified     Hand Dominance Right   Extremity/Trunk Assessment Upper Extremity Assessment Upper Extremity Assessment: Overall WFL for tasks assessed;RUE deficits/detail RUE Deficits / Details: limited ROM shoulder due to pain from rib fractures   Lower Extremity Assessment Lower Extremity Assessment: Defer to PT evaluation   Cervical / Trunk Assessment Cervical / Trunk Assessment: Normal   Communication Communication Communication: No difficulties   Cognition Arousal/Alertness: Awake/alert Behavior During Therapy: WFL for tasks assessed/performed Overall Cognitive Status: Within Functional Limits for tasks assessed                     General Comments       Exercises Exercises: Other exercises Other Exercises Other Exercises: encouraged by foot pumps Other Exercises: encouraged incentive spirometer   Shoulder Instructions  Home Living Family/patient expects to be discharged to:: Private residence Living Arrangements: Spouse/significant other;Other relatives Available Help at Discharge: Family;Available 24 hours/day Type of Home: House Home Access: Stairs to enter [employer] of Steps: 3 Entrance Stairs-Rails: None Home Layout: One level     Bathroom Shower/Tub: Tub/shower unit Shower/tub characteristics:  Door Firefighter: Standard Bathroom Accessibility: Yes How Accessible: Accessible via walker Home Equipment: Walker - 2 wheels;Walker - 4 wheels;Shower seat;Wheelchair - manual          Prior Functioning/Environment Level of Independence: Independent             OT Diagnosis: Generalized weakness;Acute pain   OT Problem List: Decreased strength;Decreased range of motion;Decreased activity tolerance;Impaired balance (sitting and/or standing);Decreased safety awareness;Decreased knowledge of use of DME or AE;Cardiopulmonary status limiting activity;Pain   OT Treatment/Interventions: Self-care/ADL training;DME and/or AE instruction;Therapeutic activities;Patient/family education    OT Goals(Current goals can be found in the care plan section) Acute Rehab OT Goals Patient Stated Goal: to go home OT Goal Formulation: With patient Time For Goal Achievement: 09/06/15 Potential to Achieve Goals: Good ADL Goals Pt Will Perform Lower Body Bathing: with adaptive equipment;sit to/from stand;with supervision;with set-up Pt Will Perform Lower Body Dressing: with set-up;with supervision;with adaptive equipment;sit to/from stand Pt Will Transfer to Toilet: with supervision;with set-up;ambulating;bedside commode Pt Will Perform Toileting - Clothing Manipulation and hygiene: with modified independence;sit to/from stand  OT Frequency: Min 2X/week   Barriers to D/C:            Co-evaluation              End of Session Equipment Utilized During Treatment: Gait belt;Rolling walker Nurse Communication: Mobility status;Weight bearing status;Precautions  Activity Tolerance: Patient tolerated treatment well Patient left: in chair;with call bell/phone within reach   Time: 1605-1630 OT Time Calculation (min): 25 min Charges:  OT General Charges $OT Visit: 1 Procedure OT Evaluation $OT Eval Moderate Complexity: 1 Procedure OT Treatments $Self Care/Home Management : 8-22  mins G-Codes:    Aurielle Slingerland,HILLARY 2015/09/17, 4:44 PM   [facility], OTR/L  [phone] 2015/09/17

## 2015-08-23 NOTE — Progress Notes (Signed)
Pt transferred to 5N, room 26.   Marland Kitchen.BP 130/79 mmHg  Pulse 92  Temp(Src) 98.1 F (36.7 C) (Oral)  Resp 26  Ht 5\' 4"  (1.626 m)  Wt 49.9 kg (110 lb 0.2 oz)  BMI 18.87 kg/m2  SpO2 100%

## 2015-08-23 NOTE — Consult Note (Signed)
ORTHOPAEDIC CONSULTATION  REQUESTING PHYSICIAN: Trauma Md, MD  Chief Complaint: R iliac crest fracture  HPI: Jacob Edwards is a 55 y.o. male who fell from a ladder approximately 109f in height yesterday, landing on a brick surface.  He denies hitting his head or LOC.  He complain of R sided chest wall pain, especially with deep inspiration.  He also has pain in the R hip with ambulation.  He was able to mobilize after the fall, and presented to the ED for further evaluation.  In the ED, he was found to have multiple fractures of the R ribs as well as a R iliac crest fracture.   Past Medical History  Diagnosis Date  . Asthma    History reviewed. No pertinent past surgical history. Social History   Social History  . Marital Status: Married    Spouse Name: N/A  . Number of Children: N/A  . Years of Education: N/A   Social History Main Topics  . Smoking status: Never Smoker   . Smokeless tobacco: None  . Alcohol Use: No  . Drug Use: No  . Sexual Activity: Not Asked   Other Topics Concern  . None   Social History Narrative   History reviewed. No pertinent family history. Allergies  Allergen Reactions  . Augmentin [Amoxicillin-Pot Clavulanate] Hives   Prior to Admission medications   Medication Sig Start Date End Date Taking? Authorizing Provider  acetaminophen (TYLENOL) 500 MG tablet Take 500 mg by mouth daily as needed for headache.   Yes Historical Provider, MD  albuterol (PROAIR HFA) 108 (90 Base) MCG/ACT inhaler Inhale 1 puff into the lungs every 4 (four) hours as needed for wheezing or shortness of breath.   Yes Historical Provider, MD  cholecalciferol (VITAMIN D) 1000 units tablet Take 1,000 Units by mouth 2 (two) times daily.   Yes Historical Provider, MD  FLUOCINONIDE EX Apply 1 application topically 2 (two) times daily as needed (rash).   Yes Historical Provider, MD  mometasone (ASMANEX 30 METERED DOSES) 220 MCG/INH inhaler Inhale 1 puff into the lungs at bedtime  as needed (shortness of breath/ wheezing).   Yes Historical Provider, MD  Omega-3 Fatty Acids (FISH OIL PO) Take 1 capsule by mouth daily.   Yes Historical Provider, MD  POTASSIUM PO Take 1 tablet by mouth 2 (two) times daily as needed (muscle cramps).   Yes Historical Provider, MD   Dg Chest 2 View  08/23/2015  CLINICAL DATA:  Bilateral rib pain.  Fall. EXAM: CHEST  2 VIEW COMPARISON:  CT 08/22/2015. FINDINGS: Mediastinum hilar structures stable. Heart size stable. Small right apical pneumothorax again noted. Right rib fractures best demonstrated by CT. IMPRESSION: Small right apical pneumothorax again noted. No interim change. Right rib fractures best demonstrated by CT. Electronically Signed   By: TMarcello Moores Register   On: 08/23/2015 08:11   Dg Chest 2 View  08/22/2015  CLINICAL DATA:  Patient fell off of a ladder onto chest, painful inspiration currently EXAM: CHEST  2 VIEW COMPARISON:  None. FINDINGS: The heart size and vascular pattern are normal. Minimally displaced fractures of the posterior lateral right sixth seventh and eighth ribs. Possible 1 cm right apical pneumothorax. No pleural effusion. Left lung is clear. IMPRESSION: Right-sided rib fractures with probable small right apical pneumothorax. Confirmatory imaging with either expiratory radiograph or CT thorax suggested. Critical Value/emergent results were called by telephone at the time of interpretation on 08/22/2015 at 5:00 pm to Dr. WBlanchie Dessert, who  verbally acknowledged these results. Electronically Signed   By: Skipper Cliche M.D.   On: 08/22/2015 17:00   Ct Chest W Contrast  08/22/2015  CLINICAL DATA:  Trauma, fell 6 feet from ladder landing on right hip and chest. Right hip pain and difficulty breathing. EXAM: CT CHEST, ABDOMEN, AND PELVIS WITH CONTRAST TECHNIQUE: Multidetector CT imaging of the chest, abdomen and pelvis was performed following the standard protocol during bolus administration of intravenous contrast. CONTRAST:   172m OMNIPAQUE IOHEXOL 300 MG/ML  SOLN COMPARISON:  Chest and pelvic radiographs earlier this day. FINDINGS: CT CHEST Small right-sided pneumothorax with apical, medial, and anterior inferior components. Minimally displaced fractures of right posterior lateral sixth through eighth ribs with adjacent pleural thickening and minimal adjacent pulmonary contusion. Additional ground-glass opacities in the right lower lobe likely additional sites of contusion. Left lung is clear. No acute traumatic aortic injury. Pleural thickening but no frank pleural effusion. No pericardial effusion. No mediastinal or hilar adenopathy. Sternum is intact. No fracture or subluxation of the thoracic spine. Included shoulder girdles are intact. CT ABDOMEN AND PELVIS No acute traumatic injury to the liver, gallbladder, spleen, adrenal glands, pancreas, or kidneys. Simple cyst in the right kidney measures 12 mm. No free air or free fluid. No mesenteric hematoma. No dilated or thickened bowel loops. Abdominal aorta is normal in caliber with mild atherosclerosis. No retroperitoneal fluid. Bladder is physiologically distended.  No pelvic free fluid. Comminuted mildly displaced fracture of the right iliac wing. No extension to the sacroiliac joints or acetabulum. Sacroiliac joints and pubic symphysis remain congruent. No additional pelvic fracture. Mild degenerative change in the lumbar spine without acute fracture. Subcutaneous soft tissue edema and hematoma adjacent to the right iliac fracture, no active extravasation. IMPRESSION: 1. Minimally displaced fractures of right posterior lateral sixth through eighth ribs with small right-sided pneumothorax and minimal pulmonary contusion adjacent to the fracture sites and in the right lower lobe. 2. Comminuted mildly displaced fracture involving the right iliac wing with adjacent soft tissue hematoma. 3. No additional acute traumatic injury in the abdomen or pelvis. Intra-abdominal and pelvic  structures appear intact. These results were called by telephone at the time of interpretation on 08/22/2015 at 6:10 pm to P[place]oud, who verbally acknowledged these results. Electronically Signed   By: MJeb LeveringM.D.   On: 08/22/2015 18:11   Ct Abdomen Pelvis W Contrast  08/22/2015  CLINICAL DATA:  Trauma, fell 6 feet from ladder landing on right hip and chest. Right hip pain and difficulty breathing. EXAM: CT CHEST, ABDOMEN, AND PELVIS WITH CONTRAST TECHNIQUE: Multidetector CT imaging of the chest, abdomen and pelvis was performed following the standard protocol during bolus administration of intravenous contrast. CONTRAST:  1091mOMNIPAQUE IOHEXOL 300 MG/ML  SOLN COMPARISON:  Chest and pelvic radiographs earlier this day. FINDINGS: CT CHEST Small right-sided pneumothorax with apical, medial, and anterior inferior components. Minimally displaced fractures of right posterior lateral sixth through eighth ribs with adjacent pleural thickening and minimal adjacent pulmonary contusion. Additional ground-glass opacities in the right lower lobe likely additional sites of contusion. Left lung is clear. No acute traumatic aortic injury. Pleural thickening but no frank pleural effusion. No pericardial effusion. No mediastinal or hilar adenopathy. Sternum is intact. No fracture or subluxation of the thoracic spine. Included shoulder girdles are intact. CT ABDOMEN AND PELVIS No acute traumatic injury to the liver, gallbladder, spleen, adrenal glands, pancreas, or kidneys. Simple cyst in the right kidney measures 12 mm. No free air or  free fluid. No mesenteric hematoma. No dilated or thickened bowel loops. Abdominal aorta is normal in caliber with mild atherosclerosis. No retroperitoneal fluid. Bladder is physiologically distended.  No pelvic free fluid. Comminuted mildly displaced fracture of the right iliac wing. No extension to the sacroiliac joints or acetabulum. Sacroiliac joints and pubic symphysis remain  congruent. No additional pelvic fracture. Mild degenerative change in the lumbar spine without acute fracture. Subcutaneous soft tissue edema and hematoma adjacent to the right iliac fracture, no active extravasation. IMPRESSION: 1. Minimally displaced fractures of right posterior lateral sixth through eighth ribs with small right-sided pneumothorax and minimal pulmonary contusion adjacent to the fracture sites and in the right lower lobe. 2. Comminuted mildly displaced fracture involving the right iliac wing with adjacent soft tissue hematoma. 3. No additional acute traumatic injury in the abdomen or pelvis. Intra-abdominal and pelvic structures appear intact. These results were called by telephone at the time of interpretation on 08/22/2015 at 6:10 pm to [place] , who verbally acknowledged these results. Electronically Signed   By: Jeb Levering M.D.   On: 08/22/2015 18:11   Dg Hip Unilat With Pelvis 2-3 Views Right  08/22/2015  CLINICAL DATA:  Fall. EXAM: DG HIP (WITH OR WITHOUT PELVIS) 2-3V RIGHT COMPARISON:  None. FINDINGS: There is a comminuted fracture involving the right iliac wing. The right hip appears located. No dislocations. Mild bilateral hip osteoarthritis. IMPRESSION: 1. Comminuted fracture involves the right iliac wing. Electronically Signed   By: Kerby Moors M.D.   On: 08/22/2015 16:57    Positive ROS: All other systems have been reviewed and were otherwise negative with the exception of those mentioned in the HPI and as above.  Labs cbc  Recent Labs  08/22/15 1730 08/22/15 1808  WBC 14.9*  --   HGB 15.3 17.0  HCT 44.4 50.0  PLT 265  --     Labs inflam No results for input(s): CRP in the last 72 hours.  Invalid input(s): ESR  Labs coag  Recent Labs  08/22/15 1730  INR 1.15     Recent Labs  08/22/15 1730 08/22/15 1808  NA 140 140  K 4.0 3.8  CL 105 102  CO2 26  --   GLUCOSE 162* 161*  BUN 12 15  CREATININE 0.93 0.80  CALCIUM 9.2  --      Physical Exam: Filed Vitals:   08/23/15 0420 08/23/15 0600  BP: 117/71 113/73  Pulse: 71 69  Temp:    Resp: 17 19   General: Alert, no acute distress Cardiovascular: No pedal edema Respiratory: Pain with deep inspiration, no cyanosis GI: No organomegaly, abdomen is soft and non-tender Skin: laceration to the R forearm that was closed in the ED and bandaged Neurologic: Sensation intact distally Psychiatric: Patient is competent for consent with normal mood and affect Lymphatic: No axillary or cervical lymphadenopathy  MUSCULOSKELETAL:  R hip has no swelling or erythema. Tender to palpation of the R hip.  Mild discomfort with motion of the hip.  Sensation intact with 2+ distal pulses.  Other extremities are atraumatic with painless ROM and NVI.  Assessment: R iliac crest fracture  Plan: Stable iliac crest fracture noted on imaging.  No indication for surgical intervention at this time.  Will allow to be weight bearing as tolerated in the R leg. Will have PT evaluate to see how the patient mobilizes.    Gae Dry, PA-C Cell [phone]   08/23/2015 8:19 AM

## 2015-08-24 DIAGNOSIS — S27321A Contusion of lung, unilateral, initial encounter: Secondary | ICD-10-CM | POA: Diagnosis present

## 2015-08-24 DIAGNOSIS — S32301A Unspecified fracture of right ilium, initial encounter for closed fracture: Secondary | ICD-10-CM

## 2015-08-24 DIAGNOSIS — S51811A Laceration without foreign body of right forearm, initial encounter: Secondary | ICD-10-CM

## 2015-08-24 DIAGNOSIS — S2249XA Multiple fractures of ribs, unspecified side, initial encounter for closed fracture: Secondary | ICD-10-CM | POA: Diagnosis present

## 2015-08-24 LAB — BASIC METABOLIC PANEL
Anion gap: 5 (ref 5–15)
BUN: 12 mg/dL (ref 6–20)
CALCIUM: 8.9 mg/dL (ref 8.9–10.3)
CHLORIDE: 102 mmol/L (ref 101–111)
CO2: 31 mmol/L (ref 22–32)
CREATININE: 0.91 mg/dL (ref 0.61–1.24)
GFR calc Af Amer: >60 mL/min (ref >60)
GFR calc non Af Amer: >60 mL/min (ref >60)
Glucose, Bld: 104 mg/dL — ABNORMAL HIGH (ref 65–99)
Potassium: 4.7 mmol/L (ref 3.5–5.1)
SODIUM: 138 mmol/L (ref 135–145)

## 2015-08-24 LAB — CBC
HCT: 39.4 % (ref 39.0–52.0)
HEMOGLOBIN: 13.1 g/dL (ref 13.0–17.0)
MCH: 30.3 pg (ref 26.0–34.0)
MCHC: 33.2 g/dL (ref 30.0–36.0)
MCV: 91 fL (ref 78.0–100.0)
Platelets: 239 10*3/uL (ref 150–400)
RBC: 4.33 MIL/uL (ref 4.22–5.81)
RDW: 11.9 % (ref 11.5–15.5)
WBC: 9 10*3/uL (ref 4.0–10.5)

## 2015-08-24 MED ORDER — POLYETHYLENE GLYCOL 3350 17 G PO PACK: 17.0000 g | PACK | Freq: Every day | ORAL | Status: AC | PRN

## 2015-08-24 MED ORDER — METHOCARBAMOL 500 MG PO TABS: 1000.0000 mg | ORAL_TABLET | Freq: Three times a day (TID) | ORAL | Status: AC | PRN

## 2015-08-24 MED ORDER — ACETAMINOPHEN 500 MG PO TABS: 500.0000 mg | ORAL_TABLET | Freq: Four times a day (QID) | ORAL | Status: AC | PRN

## 2015-08-24 MED ORDER — ENSURE ENLIVE PO LIQD: 237.0000 mL | Freq: Three times a day (TID) | ORAL | Status: AC

## 2015-08-24 MED ORDER — DOCUSATE SODIUM 100 MG PO CAPS: 100.0000 mg | ORAL_CAPSULE | Freq: Two times a day (BID) | ORAL | Status: AC

## 2015-08-24 MED ORDER — OXYCODONE HCL 5 MG PO TABS
5.0000 mg | ORAL_TABLET | ORAL | Status: AC | PRN
Start: 2015-08-24 — End: ?

## 2015-08-24 MED ORDER — IBUPROFEN 600 MG PO TABS: 600.0000 mg | ORAL_TABLET | Freq: Three times a day (TID) | ORAL | Status: AC | PRN

## 2015-08-24 NOTE — Evaluation (Signed)
Physical Therapy Evaluation Patient Details Name: Jacob AhmadiCarl Deblanc MRN: [id number] DOB: 12/22/1960 Today's Date: 08/24/2015   History of Present Illness  s/p fall off ladder with resulting R pneumothorax, R rib fxs 6-8; R pulmonary contusion; R forearm laceration - repaired in ED; R iliac wing fx (WBAT). PMHx: Asthma  Clinical Impression  Pt very pleasant and eager to be able to walk and move. Mr.Mariotti in chair on arrival, performed bed mobility, gait, stairs and HEp with questions answered. Pt benefits from education and cues throughout for upright posture, breathing technique and safety with gait. Pt motivated and limited by pain. Pt with decreased strength RLE, activity tolerance and gait who will benefit from acute therapy to maximize mobility, gait and function to decrease burden of care.     Follow Up Recommendations No PT follow up    Equipment Recommendations  Rolling walker with 5" wheels    Recommendations for Other Services       Precautions / Restrictions Precautions Precautions: Fall Restrictions Weight Bearing Restrictions: Yes RLE Weight Bearing: Weight bearing as tolerated      Mobility  Bed Mobility Overal bed mobility: Needs Assistance Bed Mobility: Sidelying to Sit;Sit to Sidelying   Sidelying to sit: Min assist     Sit to sidelying: Min assist General bed mobility comments: cues for sequence with min assist to complete transition sit<>side  Transfers Overall transfer level: Needs assistance Equipment used: Rolling walker (2 wheeled) Transfers: Sit to/from Stand Sit to Stand: Min assist Stand pivot transfers: Supervision       General transfer comment: cues for hand placement and sequence  Ambulation/Gait Ambulation/Gait assistance: Supervision Ambulation Distance (Feet): 300 Feet Assistive device: Rolling walker (2 wheeled) Gait Pattern/deviations: Step-through pattern;Decreased stride length   Gait velocity interpretation: Below normal speed for  age/gender General Gait Details: cues for posture, position in RW, safety and gait sequence  Stairs Stairs: Yes Stairs assistance: Min assist Stair Management: Backwards;With walker;Step to pattern Number of Stairs: 2 General stair comments: cues for sequence with min assist to complete and handout provided  Wheelchair Mobility    Modified Rankin (Stroke Patients Only)       Balance Overall balance assessment: Needs assistance Sitting-balance support: No upper extremity supported;Feet supported Sitting balance-Leahy Scale: Fair Sitting balance - Comments: limited by pain   Standing balance support: Bilateral upper extremity supported;During functional activity Standing balance-Leahy Scale: Fair                               Pertinent Vitals/Pain Pain Assessment: Faces Pain Score: 6  Faces Pain Scale: Hurts whole lot Pain Location: right hip and ribs Pain Descriptors / Indicators: Aching;Throbbing Pain Intervention(s): Limited activity within patient's tolerance;Monitored during session;Premedicated before session;Repositioned;Ice applied    Home Living Family/patient expects to be discharged to:: Private residence Living Arrangements: Spouse/significant other;Other relatives Available Help at Discharge: Family;Available 24 hours/day Type of Home: House Home Access: Stairs to enter Entrance Stairs-Rails: None Entrance Stairs-Number of Steps: 3 Home Layout: One level Home Equipment: Walker - standard;Walker - 4 wheels;Shower seat;Wheelchair - manual Additional Comments: equipment belonged to mother, pt states family has a BSC he can borrow as well    Prior Function Level of Independence: Independent               Hand Dominance        Extremity/Trunk Assessment   Upper Extremity Assessment: Defer to OT evaluation  Lower Extremity Assessment: RLE deficits/detail RLE Deficits / Details: decreased strength and ROM secondary to  pain    Cervical / Trunk Assessment: Other exceptions  Communication   Communication: No difficulties  Cognition Arousal/Alertness: Awake/alert Behavior During Therapy: WFL for tasks assessed/performed Overall Cognitive Status: Within Functional Limits for tasks assessed                      General Comments      Exercises General Exercises - Lower Extremity Long Arc Quad: AROM;Seated;Right;10 reps Hip Flexion/Marching: AROM;Seated;Right;10 reps      Assessment/Plan    PT Assessment Patient needs continued PT services  PT Diagnosis Difficulty walking;Acute pain   PT Problem List Decreased strength;Decreased range of motion;Decreased activity tolerance;Pain;Decreased knowledge of use of DME  PT Treatment Interventions Gait training;DME instruction;Functional mobility training;Therapeutic activities;Therapeutic exercise;Patient/family education   PT Goals (Current goals can be found in the Care Plan section) Acute Rehab PT Goals Patient Stated Goal: to be able to return to work PT Goal Formulation: With patient Time For Goal Achievement: 08/31/15 Potential to Achieve Goals: Good    Frequency Min 5X/week   Barriers to discharge        Co-evaluation               End of Session   Activity Tolerance: Patient tolerated treatment well Patient left: in chair;with call bell/phone within reach Nurse Communication: Mobility status         Time: 1610-9604 PT Time Calculation (min) (ACUTE ONLY): 24 min   Charges:   PT Evaluation $PT Eval Moderate Complexity: 1 Procedure PT Treatments $Gait Training: 8-22 mins   PT G CodesDelorse Lek 08/24/2015, 11:06 AM Delaney Meigs, PT [phone]

## 2015-08-24 NOTE — Progress Notes (Signed)
Occupational Therapy Treatment Patient Details Name: Jacob AhmadiCarl Edwards MRN: [id number] DOB: 09/30/1960 Today's Date: 08/24/2015    History of present illness s/p fall off ladder with resulting R pneumothorax, R rib fxs 6-8; R pulmonary contusion; R forearm laceration - repaired in ED; R iliac wing fx (WBAT)   OT comments  Patient is progressing nicely towards OT goals, continue plan of care for now. Pt limited by pain, but eager and motivated to regain his independence and decrease pain. Recommended patient not attempt [facility]ower [facility]. Pt required encouragement for breathing and correct posturing during mobility. Pt tends to tense up and hunch over RW due to pain.    Follow Up Recommendations  Home health OT;Supervision/Assistance - 24 hour (initially)    Equipment Recommendations  3 in 1 bedside comode;Tub/shower bench;Other (comment) (AE - reacher, sock aid, LH sponge, LH shoe horn)    Recommendations for Other Services  None at this time   Precautions / Restrictions Precautions Precautions: Fall Restrictions Weight Bearing Restrictions: Yes RLE Weight Bearing: Weight bearing as tolerated    Mobility Bed Mobility General bed mobility comments: Pt received seated in recliner upon OT entering/exiting room   Transfers Overall transfer level: Needs assistance Equipment used: Rolling walker (2 wheeled) Transfers: Sit to/from Stand Sit to Stand: Min assist         General transfer comment: Cueing for hand placement, sequencing, RLE management, and safety    Balance Overall balance assessment: Needs assistance Sitting-balance support: No upper extremity supported;Feet supported Sitting balance-Leahy Scale: Fair Sitting balance - Comments: limited by pain   Standing balance support: Bilateral upper extremity supported;During functional activity Standing balance-Leahy Scale: Fair   ADL Overall ADL's : Needs assistance/impaired Eating/Feeding: Set  up;Sitting   Grooming: Set up;Sitting   Upper Body Bathing: Set up;Sitting   Lower Body Bathing: Minimal assistance;Sit to/from stand;With adaptive equipment   Upper Body Dressing : Minimal assistance;Sitting Upper Body Dressing Details (indicate cue type and reason): due to pain Lower Body Dressing: Minimal assistance;Sit to/from stand;Cueing for safety;With adaptive equipment   Toilet Transfer: Minimal assistance;BSC;RW;Ambulation Toilet Transfer Details (indicate cue type and reason): BSC over toilet seat   Toileting - Clothing Manipulation Details (indicate cue type and reason): did not occur   Tub/Shower Transfer Details (indicate cue type and reason): did not occur, did discuss tub/shower technique using tub transfer bench vs 3-n-1. Encouraged pt to wait for HHOT to go over best and safest option for getting in/out of tub. Discussed that pt will have to remove door to use DME in tub/shower combo. Functional mobility during ADLs: Minimal assistance;Rolling walker;Cueing for sequencing;Cueing for safety General ADL Comments: Pt continues to be limited by pain, but is very motivated to regain his independence. Went over use of reacher, sock aid, LH sponge, and LH shoe horn for LB ADLs; handout administerred. Pt ambualted to/from BR for toilet transfer using BSC.      Cognition   Behavior During Therapy: WFL for tasks assessed/performed Overall Cognitive Status: Within Functional Limits for tasks assessed                 Pertinent Vitals/ Pain       Pain Assessment: Faces Faces Pain Scale: Hurts whole lot Pain Location: right hip and ribs Pain Descriptors / Indicators: Aching;Shooting;Grimacing;Discomfort;Guarding Pain Intervention(s): Limited activity within patient's tolerance;Monitored during session;Repositioned   Frequency Min 2X/week     Progress Toward Goals  OT Goals(current goals can now befound in the care plan  section)  Progress towards OT goals: Progressing  toward goals  Acute Rehab OT Goals Patient Stated Goal: to go home, get stronger, less pain  Plan Discharge plan remains appropriate;Equipment recommendations need to be updated    End of Session Equipment Utilized During Treatment: Gait belt;Rolling walker   Activity Tolerance Patient tolerated treatment well   Patient Left in chair;with call bell/phone within reach;with nursing/sitter in room  Nurse Communication Other (comment) (encouragement for patient to ambulate to/from BR using RW with staff assist)     Time: 1610-9604 OT Time Calculation (min): 23 min  Charges: OT General Charges $OT Visit: 1 Procedure OT Treatments $Self Care/Home Management : 23-37 mins  Edwin Cap , MS, OTR/L, CLT Pager: [phone]  08/24/2015, 10:31 AM

## 2015-08-24 NOTE — Care Management Note (Signed)
Case Management Note  Patient Details  Name: Jacob Edwards MRN: [id number] Date of Birth: 08/06/1961  Subjective/Objective:  Pt admitted on 08/22/15 s/p fall from ladder with PTX, rib fractures, and fx iliac wing.  PTA, pt independent, lives with family.                    Action/Plan: Pt for dc home today; needs HH follow up and DME.  [facility]  Start of care 24-48h post dc date.  RW and 3 in 1 BSC to be delivered to room prior to dc.  Pt prefers to wait on tub bench; thinks he can borrow one from family member.  Best contact # for pt is [phone].    Expected Discharge Date:   08/24/2015               Expected Discharge Plan:  Home w Home Health Services  In-House Referral:     Discharge planning Services  CM Consult  Post Acute Care Choice:  Home Health Choice offered to:  Patient  DME Arranged:  3-N-1, Walker rolling DME Agency:  [facility]  [facility]  [facility]  [facility]  Status of Service:  Completed, signed off  Medicare Important Message Given:    Date Medicare IM Given:    Medicare IM give by:    Date Additional Medicare IM Given:    Additional Medicare Important Message give by:     If discussed at Long Length of Stay Meetings, dates discussed:    Additional Comments:  Quintella BatonJulie W. Quintus Premo, RN, BSN  Trauma/Neuro ICU Case Manager [phone]

## 2015-08-24 NOTE — Discharge Summary (Signed)
[facility] Trauma Service Discharge Summary   Patient ID: Jacob Edwards MRN: [id number] DOB/AGE: 1961/04/01 55 y.o.  Admit date: 08/22/2015 Discharge date: 08/24/2015  Discharge Diagnoses Patient Active Problem List   Diagnosis Date Noted  . Fracture of multiple ribs 08/24/2015  . Fracture of right iliac wing (HCC) 08/24/2015  . Right pulmonary contusion 08/24/2015  . Laceration of right forearm 08/24/2015  . Traumatic pneumothorax 08/22/2015    Consultants Dr. Eulah Pont (Orthopedics)  Procedures None  Hospital Course:  55 y/o male with history of asthma, presenting to [facility] after a fall. The patient works for [employer].  Patient was approximately 6 feet up on a ladder when he slipped and fell, landing on his chest onto brick surface. He denies any head injury or loss of consciousness. Admits to right chest wall pain and right hip. He states when he fell he "got the wind knocked out of him". He has been able to ambulate since accident, actually walked into ED independently. Denies numbness or weakness of his extremities. Denies headache, neck pain, dizziness, confusion, visual disturbance, tinnitus, changes in speech. No low back pain or bowel/bladder incontinence. He did sustain a laceration to his right forearm as well as a long linear abrasion. The laceration was repaired in the ED.  Reports tetanus is UTD. No anti-coagulants. VSS.   Workup showed small right pneumothorax which did not require chest tube placement.  He also had right rib fractures 6-8, right pulmonary contusion, right forearm laceration, and right iliac wing fracture.  Patient was admitted and was transferred to the SDU for monitoring.  Hgb dropped mildly but then stabilized.  He was transferred to the floor on 08/23/15.  Repeat CXR shows no worsening in the pneumothorax.  Diet was advanced as tolerated.  He was doing 2200 on the IS.  On HD #3, the patient was voiding well, tolerating diet,  ambulating well, pain well controlled, vital signs stable, and felt stable for discharge home.  Patient will follow up in our office in 2 weeks and knows to call with questions or concerns.  He will follow up with Dr. Eulah Pont in 2-4 weeks regarding his right iliac wing fracture.  He was reminded about using caution with NSAIDS and bleeding.  He is recommended to use a bowel regimen to prevent constipation.      Medication List    TAKE these medications        acetaminophen 500 MG tablet  Commonly known as:  TYLENOL  Take 1-1.5 tablets (500-750 mg total) by mouth every 6 (six) hours as needed for headache.     ASMANEX 30 METERED DOSES 220 MCG/INH inhaler  Generic drug:  mometasone  Inhale 1 puff into the lungs at bedtime as needed (shortness of breath/ wheezing).     cholecalciferol 1000 units tablet  Commonly known as:  VITAMIN D  Take 1,000 Units by mouth 2 (two) times daily.     docusate sodium 100 MG capsule  Commonly known as:  COLACE  Take 1 capsule (100 mg total) by mouth 2 (two) times daily.     feeding supplement (ENSURE ENLIVE) Liqd  Take 237 mLs by mouth 3 (three) times daily with meals.     FISH OIL PO  Take 1 capsule by mouth daily.     FLUOCINONIDE EX  Apply 1 application topically 2 (two) times daily as needed (rash).     ibuprofen 600 MG tablet  Commonly known as:  ADVIL,MOTRIN  Take 1  tablet (600 mg total) by mouth every 8 (eight) hours as needed for mild pain or moderate pain. Watch for signs of bleeding, take with a meal.     methocarbamol 500 MG tablet  Commonly known as:  ROBAXIN  Take 2 tablets (1,000 mg total) by mouth every 8 (eight) hours as needed for muscle spasms.     oxyCODONE 5 MG immediate release tablet  Commonly known as:  Oxy IR/ROXICODONE  Take 1-2 tablets (5-10 mg total) by mouth every 4 (four) hours as needed for moderate pain or severe pain.     polyethylene glycol packet  Commonly known as:  MIRALAX / GLYCOLAX  Take 17 g by mouth  daily as needed. Use daily until bowel movements are more regular.  Goal is one bowel movement per day.     POTASSIUM PO  Take 1 tablet by mouth 2 (two) times daily as needed (muscle cramps).     PROAIR HFA 108 (90 Base) MCG/ACT inhaler  Generic drug:  albuterol  Inhale 1 puff into the lungs every 4 (four) hours as needed for wheezing or shortness of breath.         Follow-up Information    Follow up with [facility]. Go on 09/01/2015.   Why:  For post-hospital follow up and suture removal.  Your appointment is on 08/31/14 at 2:30pm, please arrive 30 min early to check in and fill out paperwork.     Contact information:   [address] [place] [id number] [phone]      Schedule an appointment as soon as possible for a visit with MURPHY, Jewel BaizeIMOTHY D, MD.   Specialty:  Orthopedic Surgery   Why:  For post-hospital follow up regarding your right pelvic wing fracture within 2-4 weeks.   Contact information:   [address] [place] [phone]       Signed: Nonie HoyerMegan N. Valor [place], [facility]  Trauma Service [phone]  08/24/2015, 12:31 PM

## 2015-08-24 NOTE — Discharge Instructions (Addendum)
Watch for constipation and make sure to take a stool softener.  You may need to add Miralax as needed to help you have more comfortable bowel movements.  Watch for increased shortness of breath or difficulty breathing and call us immediately or go to the ER if this occurs.    Watch for bleeding with ibuprofen/motrin.  Always take with a meal to prevent stomach upset.  Pneumothorax A pneumothorax, commonly called a collapsed lung, is a condition in which air leaks from a lung and builds up in the space between the lung and the chest wall (pleural space). The air in a pneumothorax is trapped outside the lung and takes up space, preventing the lung from fully expanding. This is a condition that usually occurs suddenly. The buildup of air may be small or large. A small pneumothorax may go away on its own. When a pneumothorax is larger, it will often require medical treatment and hospitalization.  CAUSES  A pneumothorax can sometimes happen quickly with no apparent cause. People with underlying lung problems, particularly COPD or emphysema, are at higher risk of pneumothorax. However, pneumothorax can happen quickly even in people with no prior known lung problems. Trauma, surgery, medical procedures, or injury to the chest wall can also cause a pneumothorax. SIGNS AND SYMPTOMS  Sometimes a pneumothorax will have no symptoms. When symptoms are present, they can include:  Chest pain.  Shortness of breath.  Increased rate of breathing.  Bluish color to your lips or skin (cyanosis). DIAGNOSIS  Pneumothorax is usually diagnosed by a chest X-ray or chest CT scan. Your health care provider will also take a medical history and perform a physical exam to determine why you may have a pneumothorax. TREATMENT  A small pneumothorax may go away on its own without treatment. Extra oxygen can sometimes help a small pneumothorax go away more quickly. For a larger pneumothorax or a pneumothorax that is causing  symptoms, a procedure is usually needed to drain the air.In some cases, the health care provider may drain the air using a needle. In other cases, a chest tube may be inserted into the pleural space. A chest tube is a small tube placed between the ribs and into the pleural space. This removes the extra air and allows the lung to expand back to its normal size. A large pneumothorax will usually require a hospital stay. If there is ongoing air leakage into the pleural space, then the chest tube may need to remain in place for several days until the air leak has healed. In some cases, surgery may be needed.  HOME CARE INSTRUCTIONS   Only take over-the-counter or prescription medicines as directed by your health care provider.  If a cough or pain makes it difficult for you to sleep at night, try sleeping in a semi-upright position in a recliner or by using 2 or 3 pillows.  Rest and limit activity as directed by your health care provider.  If you had a chest tube and it was removed, ask your health care provider when it is okay to remove the dressing. Until your health care provider says you can remove the dressing, do not allow it to get wet.  Do not smoke. Smoking is a risk factor for pneumothorax.  Do not fly in an airplane or scuba dive until your health care provider says it is okay.  Follow up with your health care provider as directed. SEEK IMMEDIATE MEDICAL CARE IF:   You have increasing chest pain or  shortness of breath.  You have a cough that is not controlled with suppressants.  You begin coughing up blood.  You have pain that is getting worse or is not controlled with medicines.  You cough up thick, discolored mucus (sputum) that is yellow to green in color.  You have redness, increasing pain, or discharge at the site where a chest tube had been in place (if your pneumothorax was treated with a chest tube).  The site where your chest tube was located opens up.  You feel air  coming out of the site where the chest tube was placed.  You have a fever or persistent symptoms for more than 2-3 days.  You have a fever and your symptoms suddenly get worse. MAKE SURE YOU:   Understand these instructions.  Will watch your condition.  Will get help right away if you are not doing well or get worse.   This information is not intended to replace advice given to you by your health care provider. Make sure you discuss any questions you have with your health care provider.   Document Released: 07/31/2005 Document Revised: 05/21/2013 Document Reviewed: 02/27/2013 Elsevier Interactive Patient Education 2016 [employer]   Simple Pelvic Fracture, Adult A pelvic fracture is a break in one of the pelvic bones. The pelvic bones include the bones that you sit on and the bones that make up the lower part of your spine. A pelvic fracture is called simple if the broken bones are stable and are not moving out of place. CAUSES  Common causes of this type of fracture include:  A fall.  A car accident.  Force or pressure applied to the pelvis. RISK FACTORS You may be at higher risk for this type of fracture if:  You play high-impact sports.  You are an older person with a condition that causes weak bones (osteoporosis).  You have a bone-weakening disease. SIGNS AND SYMPTOMS Signs and symptoms may include:  Tenderness, swelling, or bruising in the affected area.  Pain when moving the hip.  Pain when walking or standing. DIAGNOSIS A diagnosis is made with a physical exam and X-rays. Sometimes, a CT scan is also done. TREATMENT The goal of treatment is to get the bones to heal in a good position. Treatment of a simple pelvic fracture usually involves staying in bed (bed rest) and using crutches or a walker until the bones heal. Medicines may be prescribed for pain. Medicines may also be prescribed that help to prevent blood clots from forming in the legs. HOME CARE  INSTRUCTIONS Managing Pain, Stiffness, and Swelling  If directed, apply ice to the injured area:  Put ice in a plastic bag.  Place a towel between your skin and the bag.  Leave the ice on for 20 minutes, 2-3 times a day.  Raise the injured area above the level of your heart while you are sitting or lying down. Driving  Do not  drive or operate heavy machinery until your health care provider tells you it is safe to do. Activity  Stay on bed rest for as long as directed by your health care provider.  While on bed rest:  Change the position of your legs every 1-2 hours. This keeps blood moving well through both of your legs.  You may sit for as long as you feel comfortable.  After bed rest:  Avoid strenuous activities for as long as directed by your health care provider.  Return to your normal activities as  directed by your health care provider. Ask your health care provider what activities are safe for you. Safety  Do not use the injured limb to support your body weight until your health care provider says that you can. Use crutches or a walker as directed by your health care provider. General Instructions  Do not use any tobacco products, including cigarettes, chewing tobacco, or electronic cigarettes. Tobacco can delay bone healing. If you need help quitting, ask your health care provider.  Take medicines only as directed by your health care provider.  Keep all follow-up visits as directed by your health care provider. This is important. SEEK MEDICAL CARE IF:  Your pain gets worse.  Your pain is not relieved with medicines. SEEK IMMEDIATE MEDICAL CARE IF:  You feel light-headed or faint.  You develop chest pain.  You develop shortness of breath.  You have a fever.  You have blood in your urine or your stools.  You have vaginal bleeding.  You have difficulty or pain with urination or with a bowel movement.  You have difficulty or increased pain with  walking.  You have new or increased swelling in one of your legs.  You have numbness in your legs or groin area.   This information is not intended to replace advice given to you by your health care provider. Make sure you discuss any questions you have with your health care provider.   Document Released: 10/09/2001 Document Revised: 08/21/2014 Document Reviewed: 03/24/2014 Elsevier Interactive Patient Education 2016 [employer]   Rib Fracture A rib fracture is a break or crack in one of the bones of the ribs. The ribs are a group of long, curved bones that wrap around your chest and attach to your spine. They protect your lungs and other organs in the chest cavity. A broken or cracked rib is often painful, but most do not cause other problems. Most rib fractures heal on their own over time. However, rib fractures can be more serious if multiple ribs are broken or if broken ribs move out of place and push against other structures. CAUSES   A direct blow to the chest. For example, this could happen during contact sports, a car accident, or a fall against a hard object.  Repetitive movements with high force, such as pitching a baseball or having severe coughing spells. SYMPTOMS   Pain when you breathe in or cough.  Pain when someone presses on the injured area. DIAGNOSIS  Your caregiver will perform a physical exam. Various imaging tests may be ordered to confirm the diagnosis and to look for related injuries. These tests may include a chest X-ray, computed tomography (CT), magnetic resonance imaging (MRI), or a bone scan. TREATMENT  Rib fractures usually heal on their own in 1-3 months. The longer healing period is often associated with a continued cough or other aggravating activities. During the healing period, pain control is very important. Medication is usually given to control pain. Hospitalization or surgery may be needed for more severe injuries, such as those in which multiple  ribs are broken or the ribs have moved out of place.  HOME CARE INSTRUCTIONS   Avoid strenuous activity and any activities or movements that cause pain. Be careful during activities and avoid bumping the injured rib.  Gradually increase activity as directed by your caregiver.  Only take over-the-counter or prescription medications as directed by your caregiver. Do not take other medications without asking your caregiver first.  Apply ice to the injured  area for the first 1-2 days after you have been treated or as directed by your caregiver. Applying ice helps to reduce inflammation and pain.  Put ice in a plastic bag.  Place a towel between your skin and the bag.   Leave the ice on for 15-20 minutes at a time, every 2 hours while you are awake.  Perform deep breathing as directed by your caregiver. This will help prevent pneumonia, which is a common complication of a broken rib. Your caregiver may instruct you to:  Take deep breaths several times a day.  Try to cough several times a day, holding a pillow against the injured area.  Use a device called an incentive spirometer to practice deep breathing several times a day.  Drink enough fluids to keep your urine clear or pale yellow. This will help you avoid constipation.   Do not wear a rib belt or binder. These restrict breathing, which can lead to pneumonia.  SEEK IMMEDIATE MEDICAL CARE IF:   You have a fever.   You have difficulty breathing or shortness of breath.   You develop a continual cough, or you cough up thick or bloody sputum.  You feel sick to your stomach (nausea), throw up (vomit), or have abdominal pain.   You have worsening pain not controlled with medications.  MAKE SURE YOU:  Understand these instructions.  Will watch your condition.  Will get help right away if you are not doing well or get worse.   This information is not intended to replace advice given to you by your health care provider.  Make sure you discuss any questions you have with your health care provider.   Document Released: 07/31/2005 Document Revised: 04/02/2013 Document Reviewed: 10/02/2012 Elsevier Interactive Patient Education [employer]

## 2015-08-24 NOTE — Progress Notes (Signed)
Nsg Discharge Note  Admit Date:  08/22/2015 Discharge date: 08/24/2015   Jacob Edwards to be D/C'd home with home health per MD order.  AVS completed.  Copy for chart, and copy for patient signed, and dated.Patient/spouse able to verbalize understanding.  Discharge Medication:   Medication List    TAKE these medications        acetaminophen 500 MG tablet  Commonly known as:  TYLENOL  Take 1-1.5 tablets (500-750 mg total) by mouth every 6 (six) hours as needed for headache.     ASMANEX 30 METERED DOSES 220 MCG/INH inhaler  Generic drug:  mometasone  Inhale 1 puff into the lungs at bedtime as needed (shortness of breath/ wheezing).     cholecalciferol 1000 units tablet  Commonly known as:  VITAMIN D  Take 1,000 Units by mouth 2 (two) times daily.     docusate sodium 100 MG capsule  Commonly known as:  COLACE  Take 1 capsule (100 mg total) by mouth 2 (two) times daily.     feeding supplement (ENSURE ENLIVE) Liqd  Take 237 mLs by mouth 3 (three) times daily with meals.     FISH OIL PO  Take 1 capsule by mouth daily.     FLUOCINONIDE EX  Apply 1 application topically 2 (two) times daily as needed (rash).     ibuprofen 600 MG tablet  Commonly known as:  ADVIL,MOTRIN  Take 1 tablet (600 mg total) by mouth every 8 (eight) hours as needed for mild pain or moderate pain. Watch for signs of bleeding, take with a meal.     methocarbamol 500 MG tablet  Commonly known as:  ROBAXIN  Take 2 tablets (1,000 mg total) by mouth every 8 (eight) hours as needed for muscle spasms.     oxyCODONE 5 MG immediate release tablet  Commonly known as:  Oxy IR/ROXICODONE  Take 1-2 tablets (5-10 mg total) by mouth every 4 (four) hours as needed for moderate pain or severe pain.     polyethylene glycol packet  Commonly known as:  MIRALAX / GLYCOLAX  Take 17 g by mouth daily as needed. Use daily until bowel movements are more regular.  Goal is one bowel movement per day.     POTASSIUM PO  Take 1  tablet by mouth 2 (two) times daily as needed (muscle cramps).     PROAIR HFA 108 (90 Base) MCG/ACT inhaler  Generic drug:  albuterol  Inhale 1 puff into the lungs every 4 (four) hours as needed for wheezing or shortness of breath.        Discharge Assessment: Filed Vitals:   08/23/15 2201 08/24/15 0500  BP: 110/72 110/77  Pulse: 86 83  Temp: 98.7 F (37.1 C) 98 F (36.7 C)  Resp: 18 18   Skin clean, dry and intact without evidence of skin break down, no evidence of skin tears noted. IV catheter discontinued catheter tip intact. Site without signs and symptoms of complications - no redness or edema noted at insertion site, patient denies c/o pain - only slight tenderness at site.  Dressing with slight pressure applied.  D/c Instructions-Education: Discharge instructions given to patient/family with verbalized understanding. D/c education completed with patient/family including follow up instructions, medication list, d/c activities limitations if indicated, with other d/c instructions as indicated by MD - patient able to verbalize understanding, all questions fully answered. Patient instructed to return to ED, call 911, or call MD for any changes in condition.  Patient in room getting dressed  with help of spouse. Patient educated to call when they are ready for a wheelchair to take them their car.   Tobin Chadracy Romir Klimowicz, RN 08/24/2015 3:21 PM

## 2015-09-15 ENCOUNTER — Other Ambulatory Visit: Payer: Self-pay | Admitting: Internal Medicine

## 2015-09-15 DIAGNOSIS — R945 Abnormal results of liver function studies: Secondary | ICD-10-CM

## 2015-11-12 ENCOUNTER — Ambulatory Visit
Admission: RE | Admit: 2015-11-12 | Discharge: 2015-11-12 | Disposition: A | Discharge status: Home / Self Care | Payer: BLUE CROSS/BLUE SHIELD | Source: Ambulatory Visit | Attending: Internal Medicine | Admitting: Internal Medicine

## 2015-11-12 DIAGNOSIS — R945 Abnormal results of liver function studies: Secondary | ICD-10-CM

## 2016-07-20 IMAGING — CT CT CHEST W/ CM
2 of 5 series · 13 of 46 positions shown, 15 images · IV contrast (omnipaque)
Comparison: Chest and pelvic radiographs earlier this day.

CLINICAL DATA: Trauma, fell 6 feet from ladder landing on right hip
and chest. Right hip pain and difficulty breathing.

EXAM:
CT CHEST, ABDOMEN, AND PELVIS WITH CONTRAST
TECHNIQUE: Multidetector CT imaging of the chest, abdomen and pelvis was
performed following the standard protocol during bolus
administration of intravenous contrast.
CONTRAST:  100mL OMNIPAQUE IOHEXOL 300 MG/ML  SOLN

[Series 2: cap with 5mm st · axial · 0.68mm/px · z∈[-564,-44]mm · 10 of 118 slices shown, 12 images]
[im 7/118  soft-tissue]
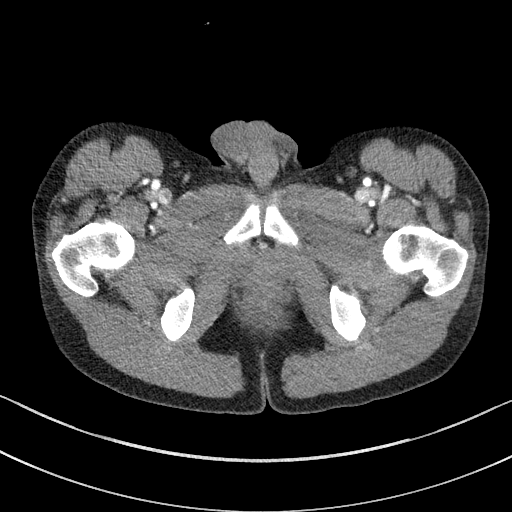
[im 7/118  bone]
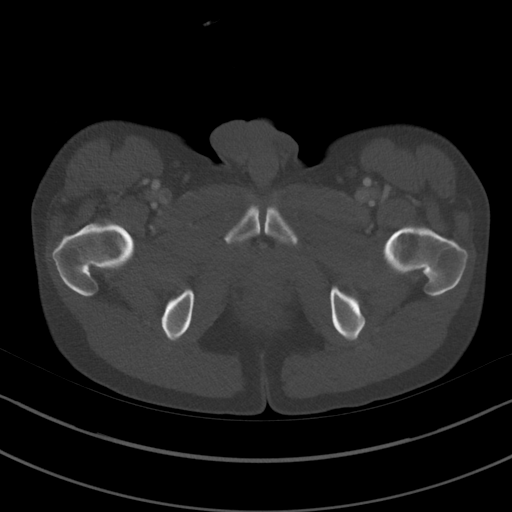
[im 20/118  soft-tissue]
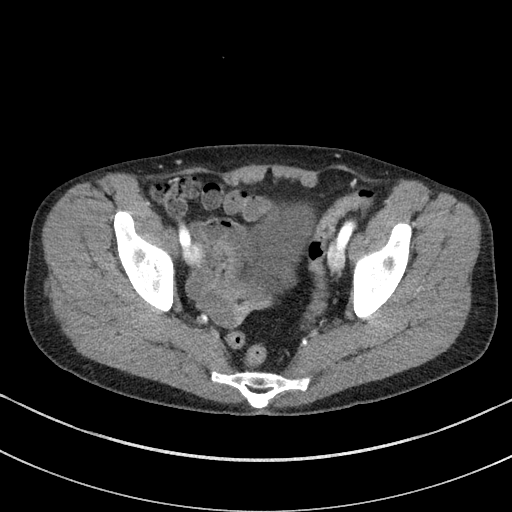
[im 33/118  soft-tissue]
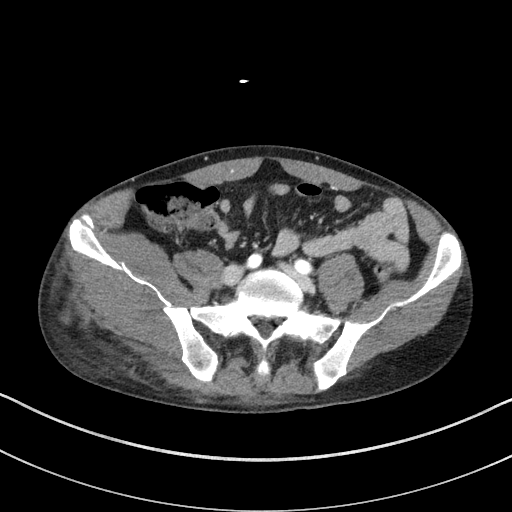
[im 40/118  soft-tissue]
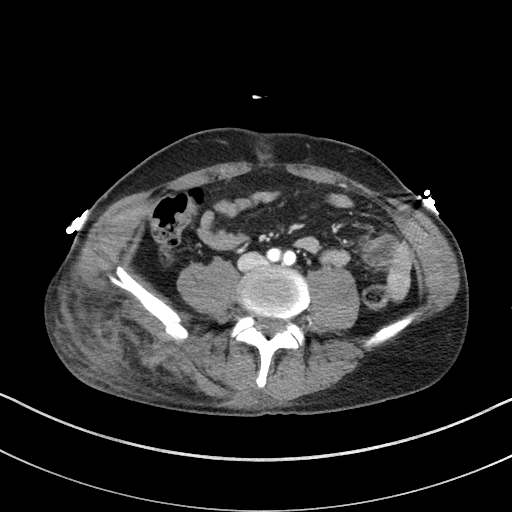
[im 53/118  soft-tissue]
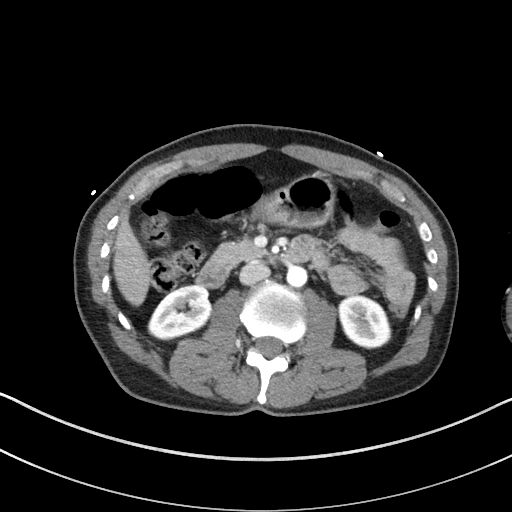
[im 66/118  soft-tissue]
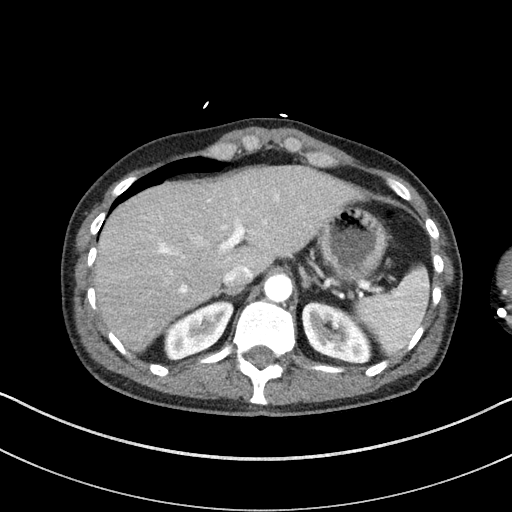
[im 79/118  soft-tissue]
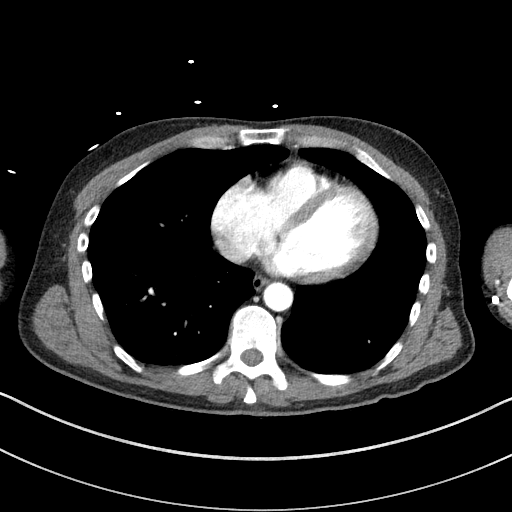
[im 85/118  soft-tissue]
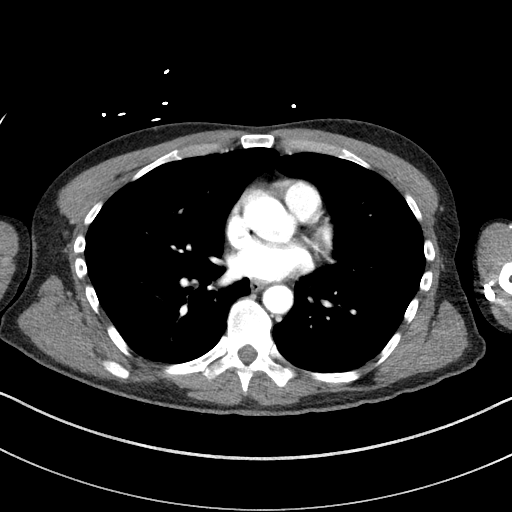
[im 98/118  soft-tissue]
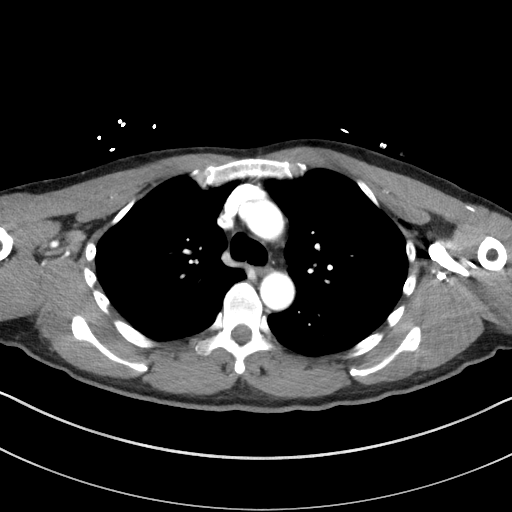
[im 98/118  bone]
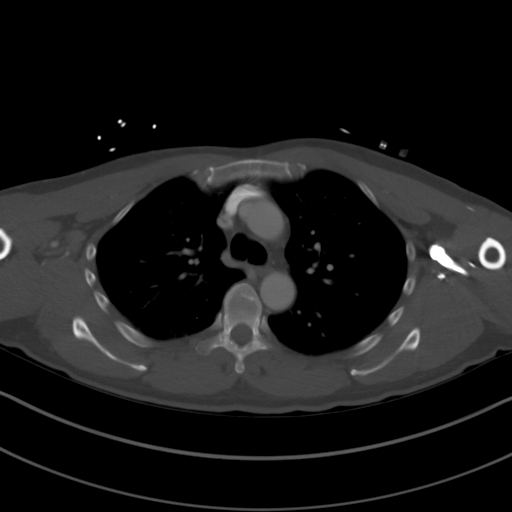
[im 111/118  soft-tissue]
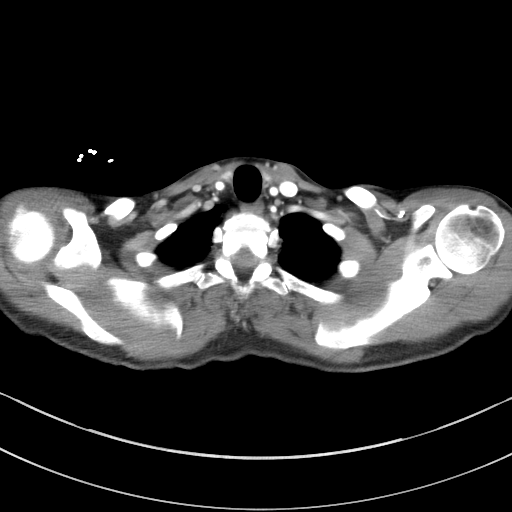

[Series 4: cap with 3mm st cor · coronal · 0.59mm/px · 3 of 61 slices shown]
[im 21/61  soft-tissue]
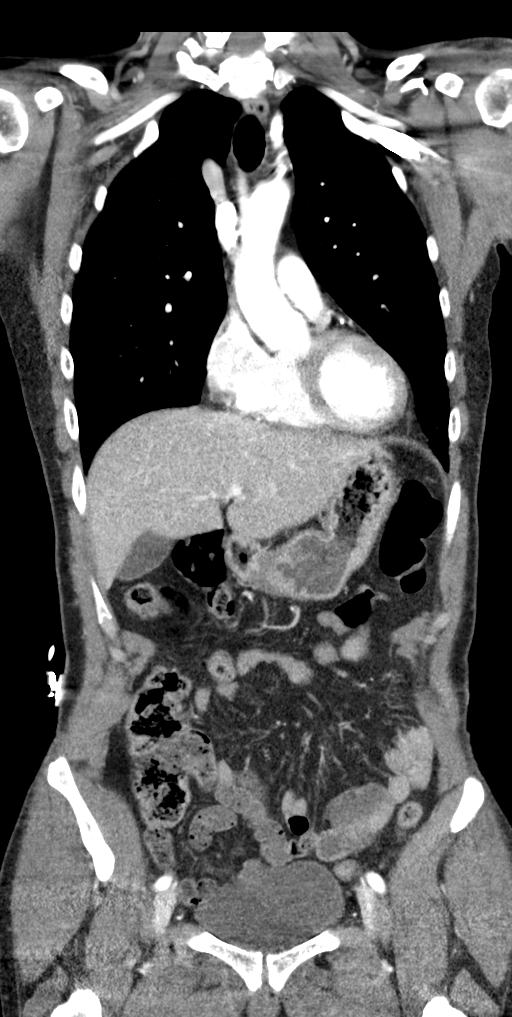
[im 27/61  soft-tissue]
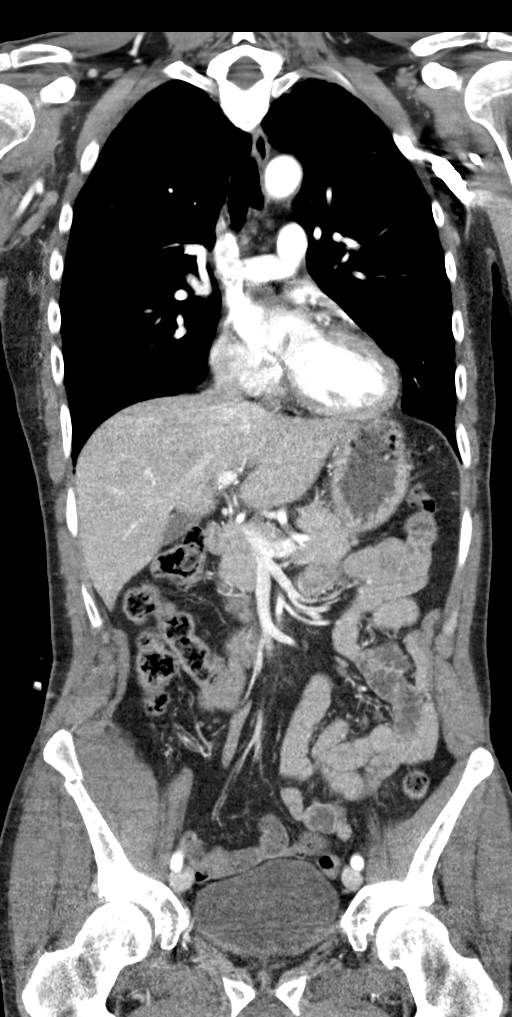
[im 34/61  soft-tissue]
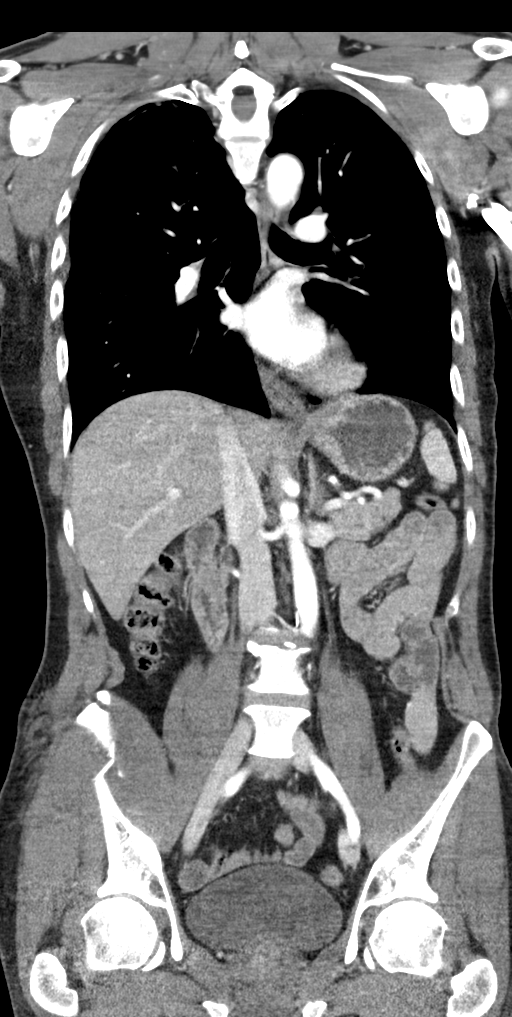

[13 of 46 positions shown; findings below may reference images not displayed]

FINDINGS: CT CHEST

Small right-sided pneumothorax with apical, medial, and anterior
inferior components. Minimally displaced fractures of right
posterior lateral sixth through eighth ribs with adjacent pleural
thickening and minimal adjacent pulmonary contusion. Additional
ground-glass opacities in the right lower lobe likely additional
sites of contusion. Left lung is clear.

No acute traumatic aortic injury. Pleural thickening but no frank
pleural effusion. No pericardial effusion. No mediastinal or hilar
adenopathy. Sternum is intact. No fracture or subluxation of the
thoracic spine. Included shoulder girdles are intact.

CT ABDOMEN AND PELVIS

No acute traumatic injury to the liver, gallbladder, spleen, adrenal
glands, pancreas, or kidneys. Simple cyst in the right kidney
measures 12 mm.

No free air or free fluid. No mesenteric hematoma. No dilated or
thickened bowel loops. Abdominal aorta is normal in caliber with
mild atherosclerosis. No retroperitoneal fluid.

Bladder is physiologically distended.  No pelvic free fluid.

Comminuted mildly displaced fracture of the right iliac wing. No
extension to the sacroiliac joints or acetabulum. Sacroiliac joints
and pubic symphysis remain congruent. No additional pelvic fracture.
Mild degenerative change in the lumbar spine without acute fracture.
Subcutaneous soft tissue edema and hematoma adjacent to the right
iliac fracture, no active extravasation.
IMPRESSION: 1. Minimally displaced fractures of right posterior lateral sixth
through eighth ribs with small right-sided pneumothorax and minimal
pulmonary contusion adjacent to the fracture sites and in the right
lower lobe.
2. Comminuted mildly displaced fracture involving the right iliac
wing with adjacent soft tissue hematoma.
3. No additional acute traumatic injury in the abdomen or pelvis.
Intra-abdominal and pelvic structures appear intact.
These results were called by telephone at the time of interpretation
on 08/22/2015 at [DATE] to PA AMUSAT SIJI , who verbally
acknowledged these results.

## 2016-10-10 IMAGING — US US ABDOMEN COMPLETE
1 series · 14 of 25 positions shown · non-contrast
Comparison: CT abdomen and pelvis August 22, 2015

CLINICAL DATA: Abnormal liver enzymes

EXAM:
ABDOMEN ULTRASOUND COMPLETE

[Series 1: us abdomen complete · 0.28mm/px · 14 of 72 slices shown]
[im 1/72]
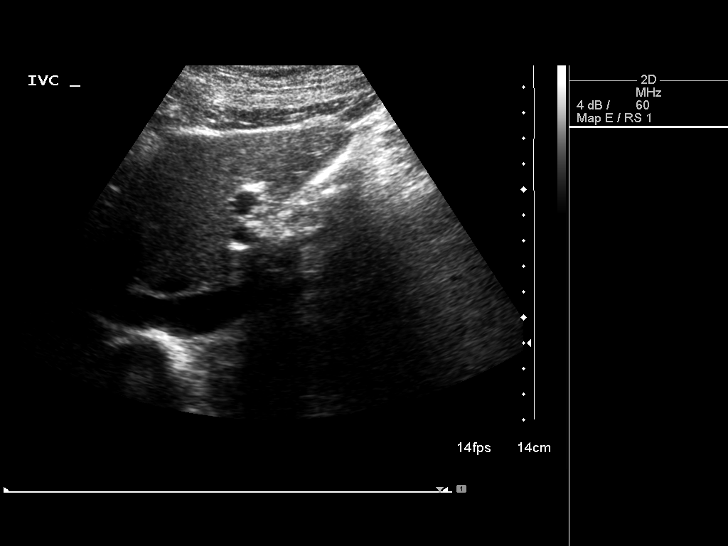
[im 6/72]
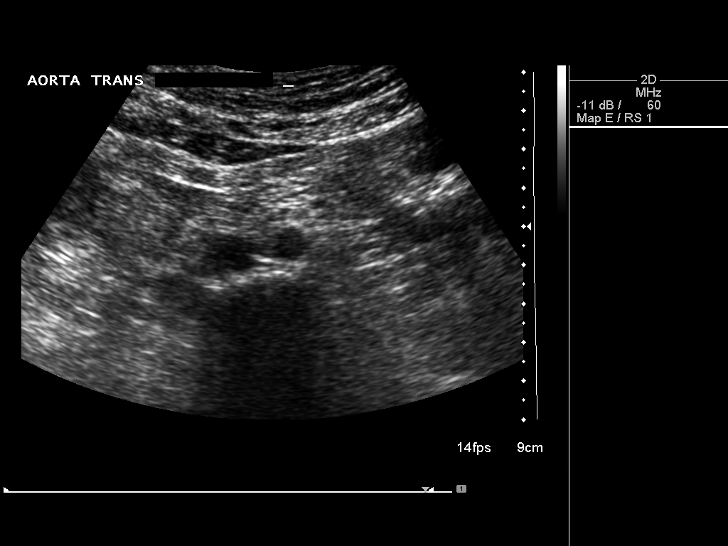
[im 12/72]
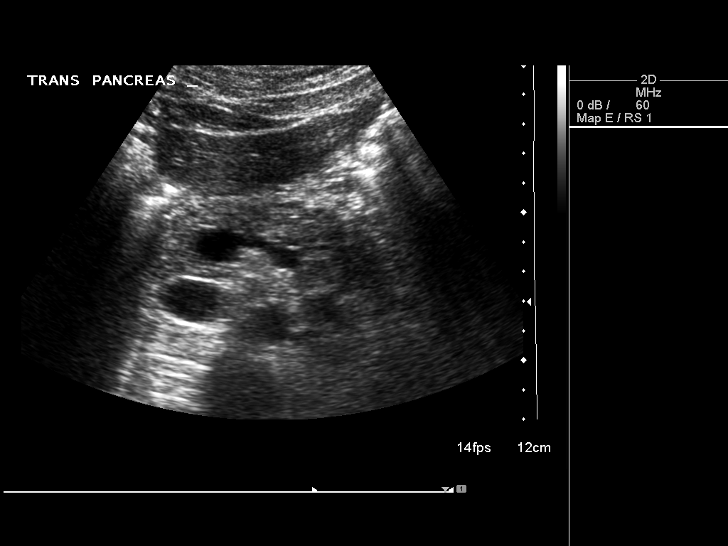
[im 18/72]
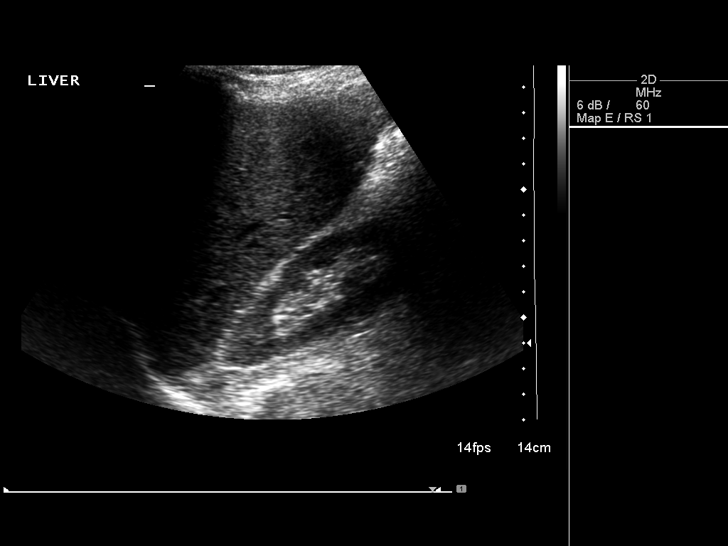
[im 24/72]
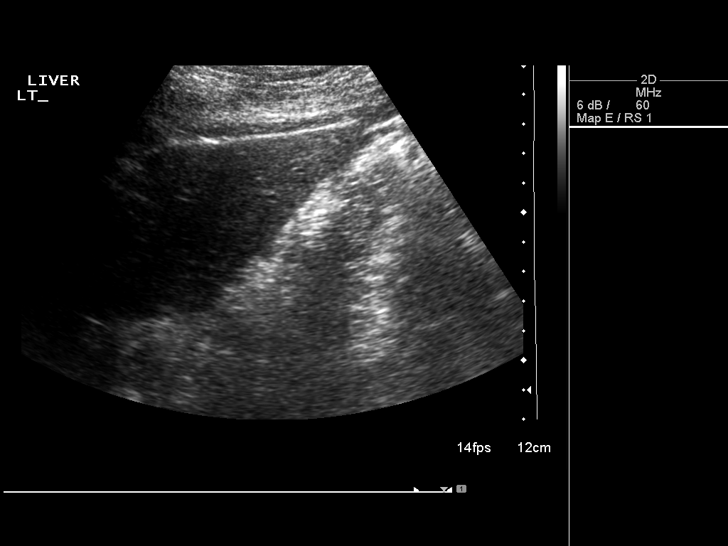
[im 27/72]
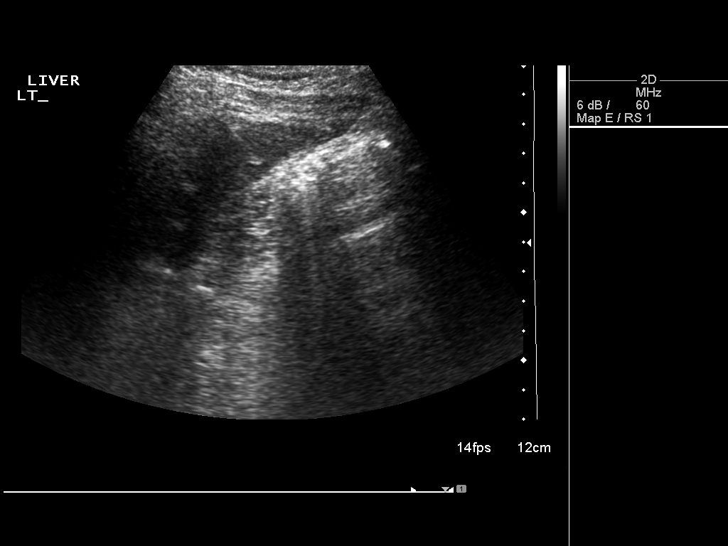
[im 33/72]
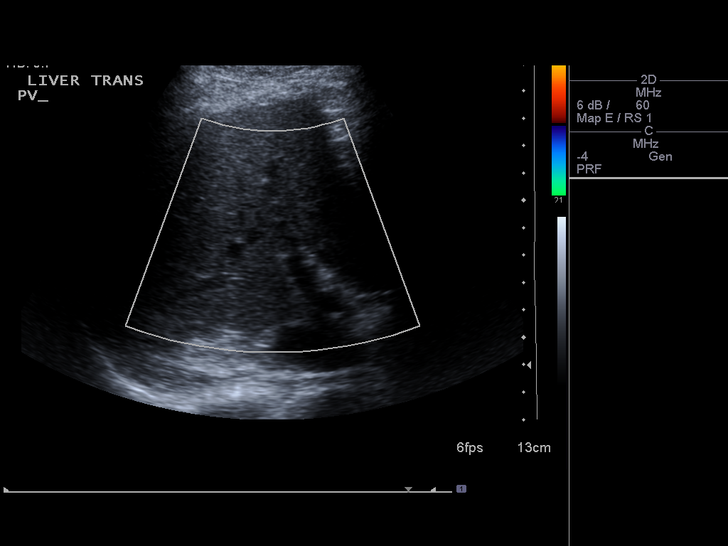
[im 39/72]
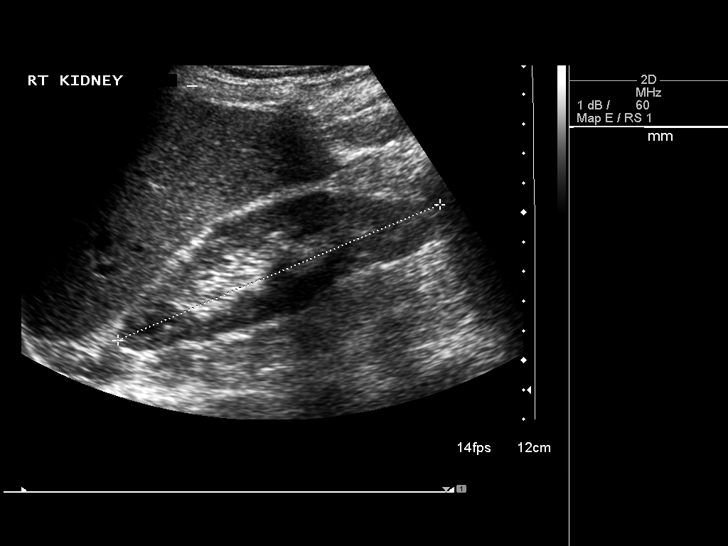
[im 45/72]
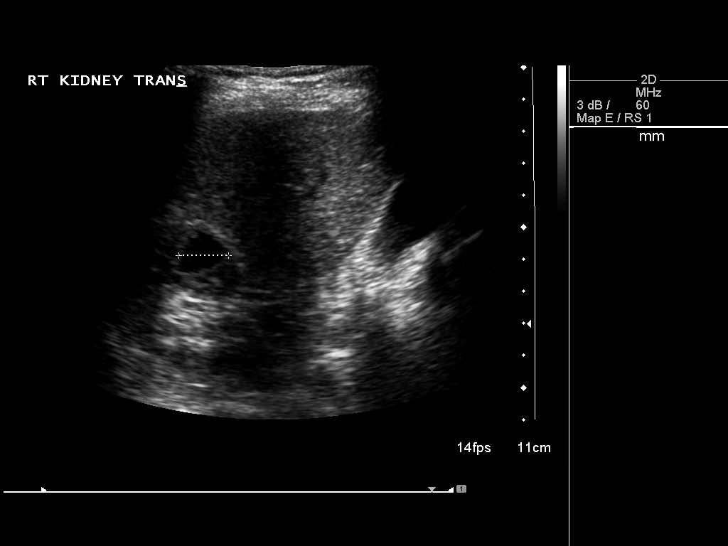
[im 48/72]
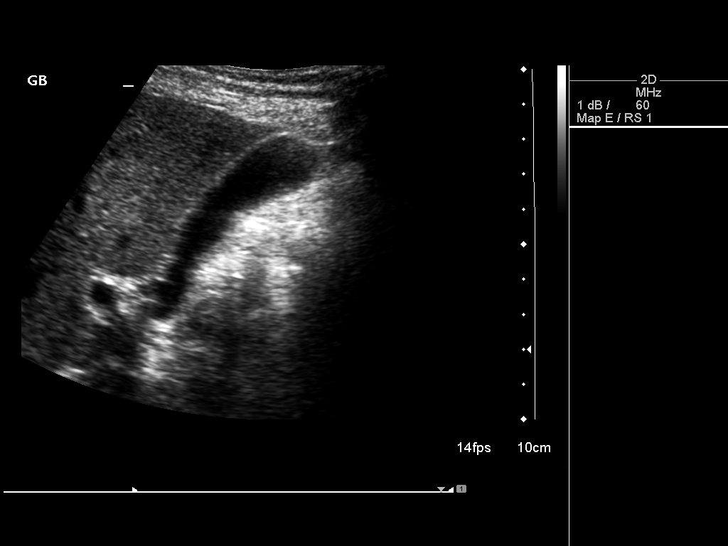
[im 54/72]
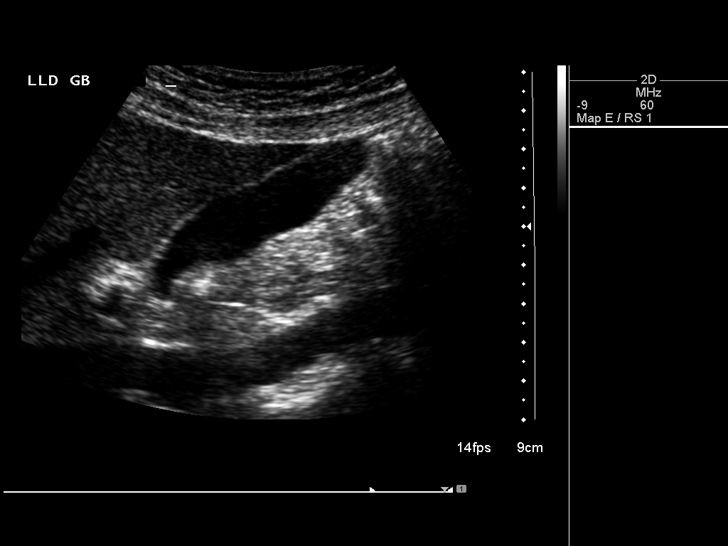
[im 60/72]
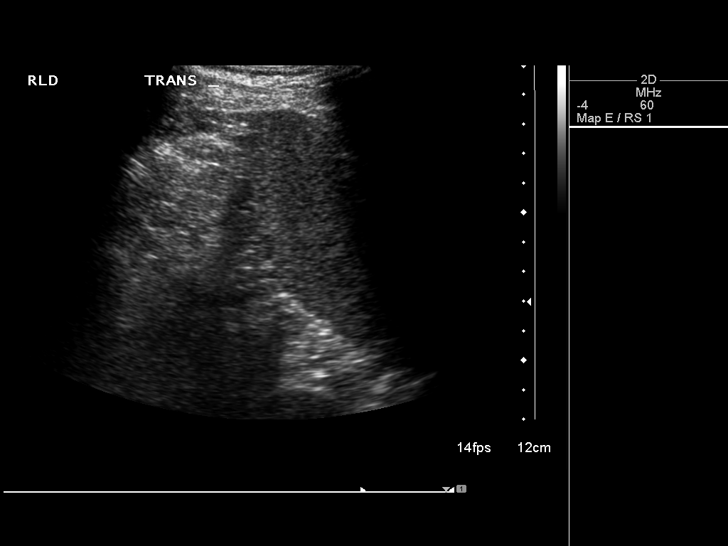
[im 66/72]
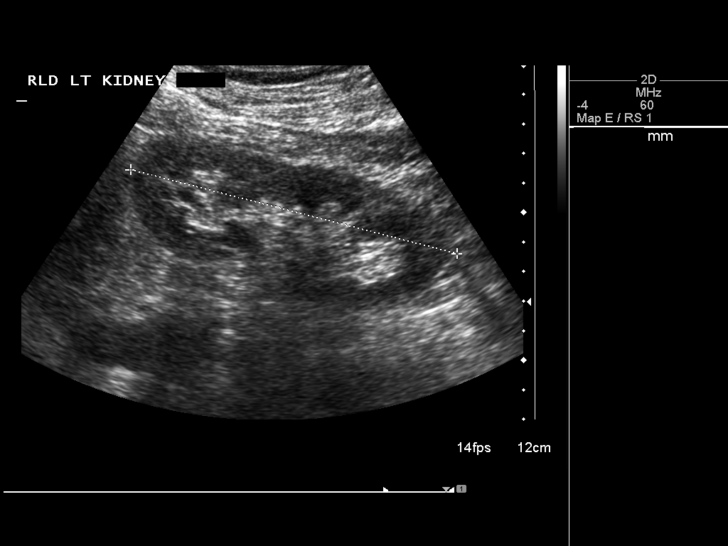
[im 72/72]
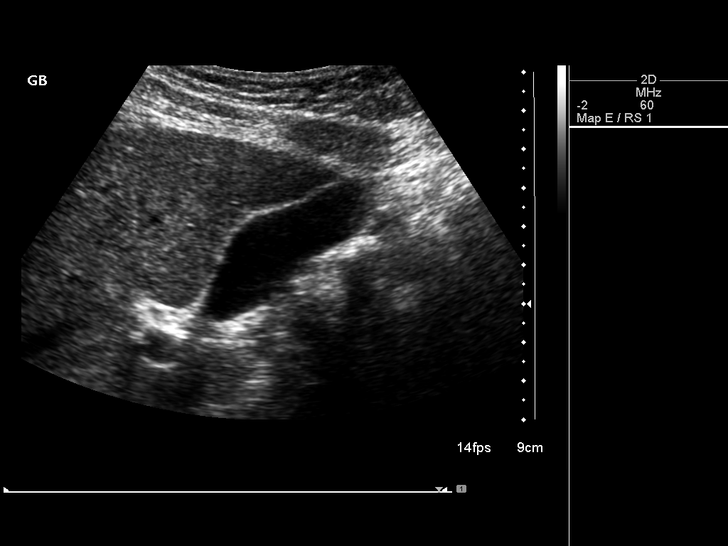

[14 of 25 positions shown; findings below may reference images not displayed]

FINDINGS: Gallbladder: No gallstones or wall thickening visualized. There is
no pericholecystic fluid. No sonographic Murphy sign noted by
sonographer.

Common bile duct: Diameter: 4 mm. There is no intrahepatic, common
hepatic, or common bile duct dilatation.

Liver: No focal lesion identified. Within normal limits in
parenchymal echogenicity.

IVC: No abnormality visualized.

Pancreas: No mass or inflammatory focus.

Spleen: Size and appearance within normal limits.

Right Kidney: Length: 11.8 cm. Echogenicity within normal limits. No
hydronephrosis visualized. There is a cyst in the mid kidney
measuring 1.5 x 1.3 x 1.6 cm.

Left Kidney: Length: 11.5 cm. Echogenicity within normal limits. No
mass or hydronephrosis visualized.

Abdominal aorta: No aneurysm visualized.

Other findings: No demonstrable ascites.
IMPRESSION: Small cyst mid right kidney.  Study otherwise unremarkable.

## 2017-12-03 DIAGNOSIS — E7800 Pure hypercholesterolemia, unspecified: Secondary | ICD-10-CM | POA: Diagnosis not present

## 2017-12-03 DIAGNOSIS — Z125 Encounter for screening for malignant neoplasm of prostate: Secondary | ICD-10-CM | POA: Diagnosis not present

## 2017-12-03 DIAGNOSIS — Z0000 Encounter for general adult medical examination without abnormal findings: Secondary | ICD-10-CM | POA: Diagnosis not present

## 2017-12-10 DIAGNOSIS — Z0000 Encounter for general adult medical examination without abnormal findings: Secondary | ICD-10-CM | POA: Diagnosis not present

## 2017-12-10 DIAGNOSIS — J301 Allergic rhinitis due to pollen: Secondary | ICD-10-CM | POA: Diagnosis not present

## 2017-12-10 DIAGNOSIS — J4520 Mild intermittent asthma, uncomplicated: Secondary | ICD-10-CM | POA: Diagnosis not present

## 2017-12-10 DIAGNOSIS — Z23 Encounter for immunization: Secondary | ICD-10-CM | POA: Diagnosis not present

## 2023-05-10 ENCOUNTER — Other Ambulatory Visit (HOSPITAL_BASED_OUTPATIENT_CLINIC_OR_DEPARTMENT_OTHER): Payer: Self-pay

## 2023-05-10 DIAGNOSIS — E7800 Pure hypercholesterolemia, unspecified: Secondary | ICD-10-CM

## 2023-05-17 ENCOUNTER — Ambulatory Visit (HOSPITAL_BASED_OUTPATIENT_CLINIC_OR_DEPARTMENT_OTHER)
Admission: RE | Admit: 2023-05-17 | Discharge: 2023-05-17 | Disposition: A | Discharge status: Home / Self Care | Payer: BLUE CROSS/BLUE SHIELD | Source: Ambulatory Visit

## 2023-05-17 DIAGNOSIS — E7800 Pure hypercholesterolemia, unspecified: Secondary | ICD-10-CM | POA: Insufficient documentation
